# Patient Record
Sex: Female | Born: 2000
Health system: Southern US, Community
[De-identification: ages and names within clinical notes are randomized; demographics above are authoritative.]

## PROBLEM LIST (undated history)

## (undated) DIAGNOSIS — I498 Other specified cardiac arrhythmias: Secondary | ICD-10-CM

## (undated) DIAGNOSIS — G90A Postural orthostatic tachycardia syndrome (POTS): Secondary | ICD-10-CM

## (undated) DIAGNOSIS — R519 Headache, unspecified: Secondary | ICD-10-CM

## (undated) DIAGNOSIS — A069 Amebiasis, unspecified: Secondary | ICD-10-CM

## (undated) DIAGNOSIS — B070 Plantar wart: Secondary | ICD-10-CM

## (undated) DIAGNOSIS — Z8709 Personal history of other diseases of the respiratory system: Secondary | ICD-10-CM

## (undated) DIAGNOSIS — J339 Nasal polyp, unspecified: Secondary | ICD-10-CM

## (undated) DIAGNOSIS — F32A Depression, unspecified: Secondary | ICD-10-CM

## (undated) DIAGNOSIS — K5900 Constipation, unspecified: Secondary | ICD-10-CM

## (undated) DIAGNOSIS — M419 Scoliosis, unspecified: Secondary | ICD-10-CM

## (undated) DIAGNOSIS — F429 Obsessive-compulsive disorder, unspecified: Secondary | ICD-10-CM

## (undated) DIAGNOSIS — E559 Vitamin D deficiency, unspecified: Secondary | ICD-10-CM

## (undated) DIAGNOSIS — A072 Cryptosporidiosis: Secondary | ICD-10-CM

## (undated) DIAGNOSIS — K589 Irritable bowel syndrome without diarrhea: Secondary | ICD-10-CM

## (undated) DIAGNOSIS — G901 Familial dysautonomia [Riley-Day]: Secondary | ICD-10-CM

## (undated) DIAGNOSIS — F329 Major depressive disorder, single episode, unspecified: Secondary | ICD-10-CM

## (undated) HISTORY — DX: Familial dysautonomia (riley-day): G90.1

## (undated) HISTORY — DX: Personal history of other diseases of the respiratory system: Z87.09

## (undated) HISTORY — DX: Cryptosporidiosis: A07.2

## (undated) HISTORY — DX: Obsessive-compulsive disorder, unspecified: F42.9

## (undated) HISTORY — DX: Postural orthostatic tachycardia syndrome (POTS): G90.A

## (undated) HISTORY — DX: Plantar wart: B07.0

## (undated) HISTORY — DX: Amebiasis, unspecified: A06.9

## (undated) HISTORY — DX: Scoliosis, unspecified: M41.9

## (undated) HISTORY — DX: Nasal polyp, unspecified: J33.9

## (undated) HISTORY — DX: Irritable bowel syndrome, unspecified: K58.9

## (undated) HISTORY — DX: Depression, unspecified: F32.A

## (undated) HISTORY — DX: Vitamin D deficiency, unspecified: E55.9

## (undated) HISTORY — PX: OTHER SURGICAL HISTORY: SHX169

## (undated) HISTORY — DX: Other specified cardiac arrhythmias: I49.8

## (undated) HISTORY — DX: Constipation, unspecified: K59.00

## (undated) HISTORY — DX: Headache, unspecified: R51.9

---
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
## 2022-01-08 ENCOUNTER — Ambulatory Visit (INDEPENDENT_AMBULATORY_CARE_PROVIDER_SITE_OTHER): Payer: 59 | Admitting: Addiction (Substance Use Disorder)

## 2022-01-08 DIAGNOSIS — F411 Generalized anxiety disorder: Secondary | ICD-10-CM

## 2022-01-08 NOTE — Progress Notes (Signed)
[facility] Counselor/Therapist Progress Note  Patient ID: April Williams, MRN: [id number],    Date: 01/08/2022  Time Spent: 47mns  Treatment Type: Individual Therapy  Reported Symptoms: stable, content  Mental Status Exam:  Appearance:   Casual and Neat     Behavior:  Appropriate  Motor:  Normal  Speech/Language:   Clear and Coherent  Affect:  Appropriate  Mood:  normal  Thought process:  normal  Thought content:    WNL  Sensory/Perceptual disturbances:    WNL  Orientation:  x4  Attention:  Good  Concentration:  Good  Memory:  WNL  Fund of knowledge:   Good  Insight:    Good  Judgment:   Good  Impulse Control:  Good   Risk Assessment: Danger to Self:  No Self-injurious Behavior: No Danger to Others: No Duty to Warn:no Physical Aggression / Violence:No  Access to Firearms a concern: No  Gang Involvement:No   Virtual Visit via VIDEO: I connected with client by MyChart video enabled telemedicine/telehealth application, with their informed consent, and verified client privacy and that I am speaking with the correct person using two identifiers. I discussed the limitations, risks, security and privacy concerns of performing psychotherapy and management service virtually and confirmed their location. I also discussed with the patient that there may be a patient responsible charge related to this service and to confirm with the front desk if their insurance covers teletherapy. I also discussed with the patient the availability of in person appointments. The patient expressed understanding and agreed to proceed. I discussed the treatment planning with the client. The client was provided an opportunity to ask questions and all were answered. The client agreed with the plan and demonstrated an understanding of the instructions. The client was advised to call our office if symptoms worsen or feel they are in a crisis state and need immediate contact. Client also reminded of a  crisis line number and to use 9-1-1 if there's an emergency.  Therapist Location: office; Client Location: home.   Subjective: Client reported feeling very stable and happy overall both personally and in school. Client wasn't very talkative and kept reporting there "being not much to say". 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 Client processed about her major and need to learn economics and business along with animal care to be able to meet that goal. Therapist used MI & CBT with client to validate her emotions and encourage her to continue working towards her major. Therapist assessed client safety and stability; client denied SI/HI/AVH and reported stable mood.   Interventions: Cognitive Behavioral Therapy and Motivational Interviewing   Diagnosis:   ICD-10-CM   1. Generalized anxiety disorder  F41.1       Plan of Care: Client to return for weekly therapy with KSammuel Cooper therapist, to review again in 6 months.  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  Client is to seeing medication provider for support of mood management. & continue seeing medical providers for management of POTS. Client to engage in CBT: challenging negative internal ruminations and self-talk AEB journaling daily or expressing thoughts to support persons in their life and then challenging it with truth.  Client to engage in tapping to tap in healthy cognition that challenges negative  rumination or deep negative core belief.  Client to practice DBT distress tolerance skills to decrease crying spells and thoughts of not being able to endure their suffering AEB using mindfulness, deep breathing, and TIP to increase tolerance for discomfort, discharge emotional distress, and increase their understanding that  they can do hard things.  Progress: Client feeling confident about her work and workload and has made progress in routines to help her succeed in college. Client understands her need to work through past family trauma hx so that she can trust her mom's judge of character for her to date, but needs to take steps of giving her mom the benefit of the doubt and trust her character.   Barnie Del, LCSW, LCAS, CCTP, CCS, BSP

## 2022-01-16 ENCOUNTER — Encounter: Payer: Self-pay | Admitting: Family Medicine

## 2022-01-17 ENCOUNTER — Encounter: Payer: Self-pay | Admitting: Adult Health

## 2022-01-17 ENCOUNTER — Ambulatory Visit (INDEPENDENT_AMBULATORY_CARE_PROVIDER_SITE_OTHER): Payer: 59 | Admitting: Adult Health

## 2022-01-17 ENCOUNTER — Other Ambulatory Visit (HOSPITAL_COMMUNITY): Payer: Self-pay

## 2022-01-17 ENCOUNTER — Other Ambulatory Visit (HOSPITAL_BASED_OUTPATIENT_CLINIC_OR_DEPARTMENT_OTHER): Payer: Self-pay

## 2022-01-17 ENCOUNTER — Ambulatory Visit (INDEPENDENT_AMBULATORY_CARE_PROVIDER_SITE_OTHER): Payer: 59

## 2022-01-17 ENCOUNTER — Other Ambulatory Visit (HOSPITAL_COMMUNITY)
Admission: RE | Admit: 2022-01-17 | Discharge: 2022-01-17 | Disposition: A | Discharge status: Home / Self Care | Payer: 59 | Source: Ambulatory Visit

## 2022-01-17 VITALS — BP 114/68 | HR 85

## 2022-01-17 DIAGNOSIS — F5082 Avoidant/restrictive food intake disorder: Secondary | ICD-10-CM

## 2022-01-17 DIAGNOSIS — N760 Acute vaginitis: Secondary | ICD-10-CM | POA: Diagnosis not present

## 2022-01-17 DIAGNOSIS — B3731 Acute candidiasis of vulva and vagina: Secondary | ICD-10-CM

## 2022-01-17 DIAGNOSIS — F341 Dysthymic disorder: Secondary | ICD-10-CM | POA: Diagnosis not present

## 2022-01-17 DIAGNOSIS — B9689 Other specified bacterial agents as the cause of diseases classified elsewhere: Secondary | ICD-10-CM | POA: Diagnosis not present

## 2022-01-17 DIAGNOSIS — F422 Mixed obsessional thoughts and acts: Secondary | ICD-10-CM | POA: Diagnosis not present

## 2022-01-17 MED ORDER — FLUCONAZOLE 150 MG PO TABS
150.0000 mg | ORAL_TABLET | Freq: Once | ORAL | 0 refills | Status: AC
  Filled 2022-01-17: qty 1, 1d supply, fill #0

## 2022-01-17 MED ORDER — ARIPIPRAZOLE 2 MG PO TABS
2.0000 mg | ORAL_TABLET | Freq: Every day | ORAL | 3 refills | Status: DC
  Filled 2022-01-17: qty 90, 90d supply, fill #0
  Filled 2022-05-12 – 2022-08-09 (×4): qty 90, 90d supply, fill #1
  Filled 2022-11-10: qty 90, 90d supply, fill #2

## 2022-01-17 MED ORDER — SERTRALINE HCL 100 MG PO TABS
200.0000 mg | ORAL_TABLET | Freq: Every day | ORAL | 3 refills | Status: DC
  Filled 2022-01-17: qty 180, 90d supply, fill #0
  Filled 2022-06-07 (×2): qty 180, 90d supply, fill #1
  Filled 2022-09-10: qty 180, 90d supply, fill #2
  Filled 2022-12-12: qty 180, 90d supply, fill #3

## 2022-01-17 MED ORDER — TINIDAZOLE 500 MG PO TABS
2.0000 g | ORAL_TABLET | Freq: Every day | ORAL | 0 refills | Status: DC
  Filled 2022-01-17: qty 8, 2d supply, fill #0

## 2022-01-17 MED ORDER — TRAZODONE HCL 50 MG PO TABS
50.0000 mg | ORAL_TABLET | Freq: Every day | ORAL | 3 refills | Status: DC
  Filled 2022-01-17: qty 90, 90d supply, fill #0
  Filled 2022-05-11 – 2022-05-20 (×2): qty 90, 90d supply, fill #1
  Filled 2022-08-09 (×2): qty 90, 90d supply, fill #2
  Filled 2022-11-21: qty 90, 90d supply, fill #3

## 2022-01-17 NOTE — Progress Notes (Signed)
Pt presents to office today reporting vaginal dryness, itching, foul odor and clear/white discharge.   Pt instructed on how to do a self swab. Swab obtained and informed pt that results would be back in 24-48 hours. Pt aware she would be contacted with results via [place]. All questions answered at this time.

## 2022-01-17 NOTE — Addendum Note (Signed)
Addended by: Madalyn Rob D on: 01/17/2022 09:54 AM   Modules accepted: Orders, Level of Service

## 2022-01-17 NOTE — Progress Notes (Signed)
April Williams [id number] 2000/08/12 21 y.o.  Subjective:   Patient ID:  April Williams is a 21 y.o. (DOB [birth year]) female.  Chief Complaint: No chief complaint on file.   HPI April Williams presents to the office today for follow-up of persistent depressive disorder with melancholic features, currently moderate, Mixed obsessional thoughts and acts, and avoidant-restrictive food intake disorder (ARFID).  Describes mood today as "ok". Pleasant. Denies tearfulness. Mood symptoms - reports decreased depression, anxiety, and irritability. Mood is consistent. Stating "I'm doing ok - I feel the happiest I've been". Feels like medications work well. Stable interest and motivation. Seeing therapist - [name] Taking medications as prescribed.  Energy levels pretty good. Has a regular exercise routine. Enjoys some usual interests and activities. Single. Dating. Lives with mother - hedgehog [name]) Sister lives 1.5 hours away - niece 34 years old. Father and brother lives in [place]. Spending time with family. Appetite adequate. Weight stable. Sleeps well most nights with Trazadone. Averages 8 to 10 hours.  Focus and concentration "ok". Completing some household tasks. Managing aspects of household. Taking classes at a community college and is planning to attend a 4 year university. Denies SI or HI.  Denies AH or VH. Denies self harm.  Previous medication trials: [place] Office Visit from 09/20/2021 in [facility] [place] from 04/07/2018 in [facility] Office Visit from 09/02/2017 in [facility] Office Visit from 08/01/2017 in [facility] Office Visit from 06/30/2017 in [facility]  Total GAD-7 Score '4 5 1 5 2      '$ PHQ2-9    [place] Visit from 09/20/2021 in [place] [facility] [place] [facility] [place] from 04/07/2018 in [facility] Office Visit from 09/02/2017 in [facility] Office Visit from 08/01/2017 in [facility] Office Visit from 06/30/2017 in [facility]  PHQ-2 Total Score 3 2 0 1 0  PHQ-9 Total Score '8 7 9 11 8        '$ Review of Systems:  Review of Systems  Musculoskeletal:  Negative for gait problem.  Neurological:  Negative for tremors.  Psychiatric/Behavioral:         Please refer to HPI    Medications: I have reviewed the patient's current medications.  Current Outpatient Medications  Medication Sig Dispense Refill   ARIPiprazole (ABILIFY) 2 MG tablet Take 1 tablet (2 mg total) by mouth daily. 90 tablet 3   cholecalciferol (VITAMIN D) 1000 units tablet Take 4,000 Units by mouth daily.     Drospirenone (SLYND) 4 MG TABS Take 1 tablet by mouth daily. 84 tablet 4   ferrous sulfate 324 (65 Fe) MG TBEC Take 1 tablet by mouth in the morning and 1 tablet in the evening with meals. If causes constipation, begin miralax daily as needed. Check Hgb/ferritin in 3 months.. 90 tablet 3   Melatonin 10 MG TABS Take by mouth.     metoprolol tartrate (LOPRESSOR) 25 MG tablet TAKE 1 TABLET BY MOUTH ONCE A DAY 90 tablet 2   penicillin v potassium (VEETID) 500 MG tablet Take 1 tablet (500 mg total) by mouth 4 (four) times daily until gone 28 tablet 0   senna (SENOKOT)  8.6 MG tablet Take 2 tablets by mouth daily.     sertraline (ZOLOFT) 100 MG tablet Take 2 tablets (200 mg total) by mouth daily. 180 tablet 3   traZODone (DESYREL) 50 MG tablet Take 1 tablet (50 mg total) by mouth at bedtime. 90 tablet 3   No current facility-administered medications for this visit.    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   Please see review of systems for further details on the patient's review from today.   Objective:   Physical Exam:  There were no vitals taken for this visit.  Physical Exam Constitutional:      General: She is not in acute distress. Musculoskeletal:        General: No deformity.  Neurological:     Mental Status: She is alert and oriented to person, place, and time.     Coordination: Coordination normal.  Psychiatric:        Attention and Perception: Attention and perception normal. She does not perceive auditory or visual hallucinations.        Mood and Affect: Mood normal. Mood is not anxious or depressed. Affect is not labile, blunt, angry or inappropriate.        Speech: Speech normal.        Behavior: Behavior normal.        Thought Content: Thought content normal. Thought content is not paranoid or delusional. Thought content does not include homicidal or suicidal ideation. Thought content does not include homicidal or suicidal plan.        Cognition and Memory: Cognition and memory normal.        Judgment: Judgment normal.     Comments: Insight intact     Lab Review:     Component Value Date/Time   NA 136 01/27/2019 1015   K 4.9 01/27/2019 1015   CL 105 01/27/2019 1015   CO2 22 01/27/2019 1015   GLUCOSE 91 01/27/2019 1015   BUN 9 01/27/2019 1015   CREATININE 0.70 01/27/2019 1015   CALCIUM 9.9 01/27/2019 1015   PROT 7.2 01/27/2019 1015    ALBUMIN 4.1 12/07/2016 1402   AST 13 01/27/2019 1015   ALT 10 01/27/2019 1015   ALKPHOS 117 12/07/2016 1402   BILITOT 0.7 01/27/2019 1015   GFRNONAA NOT CALCULATED 12/07/2016 1402   GFRAA NOT CALCULATED 12/07/2016 1402       Component Value Date/Time   WBC 8.8 12/07/2016 1402   RBC 5.53 (H) 12/07/2016 1402   HGB 15.4 (H) 12/07/2016 1402   HCT 44.3 (H) 12/07/2016 1402   PLT 424 (H) 12/07/2016 1402   MCV 80.1 12/07/2016 1402   MCH 27.8 12/07/2016 1402   MCHC 34.8 12/07/2016 1402   RDW 12.0 12/07/2016 1402   LYMPHSABS 2.0 12/07/2016 1402   MONOABS 1.0 12/07/2016 1402   EOSABS 0.1 12/07/2016 1402   BASOSABS 0.0 12/07/2016 1402  No results found for: "POCLITH", "LITHIUM"   No results found for: "PHENYTOIN", "PHENOBARB", "VALPROATE", "CBMZ"   .res Assessment: Plan:    Plan:  PDMP reviewed  1. Zoloft '200mg'$  daily 2. Abilify '2mg'$  daily 3. Trazadone '25mg'$  at hs    RTC 6 months  Discussed potential metabolic side effects associated with atypical antipsychotics, as well as potential risk for movement side effects. Advised pt to contact office if movement side effects occur.    Patient advised to contact office with any questions, adverse effects, or acute worsening in signs and symptoms. Diagnoses and all orders for this visit:  Avoidant-restrictive food intake disorder (ARFID)  Persistent depressive disorder with melancholic features, currently moderate -     ARIPiprazole (ABILIFY) 2 MG tablet; Take 1 tablet (2 mg total) by mouth daily.  Mixed obsessional thoughts and acts -     traZODone (DESYREL) 50 MG tablet; Take 1 tablet (50 mg total) by mouth at bedtime. -     sertraline (ZOLOFT) 100 MG tablet; Take 2 tablets (200 mg total) by mouth daily.     Please see After Visit Summary for patient specific instructions.  Future Appointments  Date Time Provider [place]  01/30/2022  8:30 AM Early, Coralee Pesa, NP [facility]  02/05/2022  2:00 PM Barnie Del, LCSW  [facility] None  07/18/2022  8:00 AM Donabelle Molden, Berdie Ogren, NP [facility] None    No orders of the defined types were placed in this encounter.   -------------------------------

## 2022-01-18 LAB — CERVICOVAGINAL ANCILLARY ONLY
Bacterial Vaginitis (gardnerella): NEGATIVE
Candida Glabrata: NEGATIVE
Candida Vaginitis: NEGATIVE
Comment: NEGATIVE
Comment: NEGATIVE
Comment: NEGATIVE

## 2022-01-30 ENCOUNTER — Encounter (HOSPITAL_BASED_OUTPATIENT_CLINIC_OR_DEPARTMENT_OTHER): Payer: Self-pay | Admitting: Nurse Practitioner

## 2022-01-30 ENCOUNTER — Ambulatory Visit (HOSPITAL_BASED_OUTPATIENT_CLINIC_OR_DEPARTMENT_OTHER): Payer: 59 | Admitting: Nurse Practitioner

## 2022-01-30 VITALS — BP 105/75 | HR 95 | Ht 66.0 in | Wt 132.0 lb

## 2022-01-30 DIAGNOSIS — Z23 Encounter for immunization: Secondary | ICD-10-CM | POA: Diagnosis not present

## 2022-01-30 DIAGNOSIS — F422 Mixed obsessional thoughts and acts: Secondary | ICD-10-CM

## 2022-01-30 DIAGNOSIS — F341 Dysthymic disorder: Secondary | ICD-10-CM

## 2022-01-30 DIAGNOSIS — Z0000 Encounter for general adult medical examination without abnormal findings: Secondary | ICD-10-CM | POA: Diagnosis not present

## 2022-01-30 DIAGNOSIS — G90A Postural orthostatic tachycardia syndrome (POTS): Secondary | ICD-10-CM

## 2022-01-30 DIAGNOSIS — G901 Familial dysautonomia [Riley-Day]: Secondary | ICD-10-CM

## 2022-01-30 DIAGNOSIS — F4323 Adjustment disorder with mixed anxiety and depressed mood: Secondary | ICD-10-CM

## 2022-01-30 NOTE — Patient Instructions (Signed)
Thank you for choosing [place] [facility] for your Primary Care needs. I am excited for the opportunity to partner with you to meet your health care goals. It was a pleasure meeting you today!  Recommendations from today's visit: You are looking great today! Please let me know if you have any concerns or needs.   Information on diet, exercise, and health maintenance recommendations are listed below. This is information to help you be sure you are on track for optimal health and monitoring.   Please look over this and let us know if you have any questions or if you have completed any of the health maintenance outside of [place] so that we can be sure your records are up to date.  ___________________________________________________________ About Me: I am an Adult-Geriatric Nurse Practitioner with a background in caring for patients for more than 20 years with a strong intensive care background. I provide primary care and sports medicine services to patients age 39 and older within this office. My education had a strong focus on caring for the older adult population, which I am passionate about. I am also the director of the APP Fellowship with [facility].   My desire is to provide you with the best service through preventive medicine and supportive care. I consider you a part of the medical team and value your input. I work diligently to ensure that you are heard and your needs are met in a safe and effective manner. I want you to feel comfortable with me as your provider and want you to know that your health concerns are important to me.  For your information, our office hours are: Monday, Tuesday, and Thursday 8:00 AM - 5:00 PM Wednesday and Friday 8:00 AM - 12:00 PM.   In my time away from the office I am teaching new APP's within the system and am unavailable, but my partner, Dr. Burnard Bunting is in the office for emergent needs.   If you have questions or concerns,  please call our office at [phone] or send [place] a MyChart message and we will respond as quickly as possible.  ____________________________________________________________ MyChart:  For all urgent or time sensitive needs we ask that you please call the office to avoid delays. Our number is [phone]. MyChart is not constantly monitored and due to the large volume of messages a day, replies may take up to 72 business hours.  MyChart Policy: MyChart allows for you to see your visit notes, after visit summary, provider recommendations, lab and tests results, make an appointment, request refills, and contact your provider or the office for non-urgent questions or concerns. Providers are seeing patients during normal business hours and do not have built in time to review MyChart messages.  We ask that you allow a minimum of 3 business days for responses to [employer]. For this reason, please do not send urgent requests through [employer]. Please call the office at [phone]. New and ongoing conditions may require a visit. We have virtual and in person visit available for your convenience.  Complex MyChart concerns may require a visit. Your provider may request you schedule a virtual or in person visit to ensure we are providing the best care possible. MyChart messages sent after 11:00 AM on Friday will not be received by the provider until Monday morning.    Lab and Test Results: You will receive your lab and test results on MyChart as soon as they are completed and results have been  sent by the lab or testing facility. Due to this service, you will receive your results BEFORE your provider.  I review lab and tests results each morning prior to seeing patients. Some results require collaboration with other providers to ensure you are receiving the most appropriate care. For this reason, we ask that you please allow a minimum of 3-5 business days from the time the ALL results have been received  for your provider to receive and review lab and test results and contact you about these.  Most lab and test result comments from the provider will be sent through [place]. Your provider may recommend changes to the plan of care, follow-up visits, repeat testing, ask questions, or request an office visit to discuss these results. You may reply directly to this message or call the office at [phone] to provide information for the provider or set up an appointment. In some instances, you will be called with test results and recommendations. Please let us know if this is preferred and we will make note of this in your chart to provide this for you.    If you have not heard a response to your lab or test results in 5 business days from all results returning to [place], please call the office to let us know. We ask that you please avoid calling prior to this time unless there is an emergent concern. Due to high call volumes, this can delay the resulting process.  After Hours: For all non-emergency after hours needs, please call the office at [phone] and select the option to reach the on-call provider service. On-call services are shared between multiple [facility] offices and therefore it will not be possible to speak directly with your provider. On-call providers may provide medical advice and recommendations, but are unable to provide refills for maintenance medications.  For all emergency or urgent medical needs after normal business hours, we recommend that you seek care at the closest Urgent Care or Emergency Department to ensure appropriate treatment in a timely manner.  [facility] has a 24 hour emergency room located on the ground floor for your convenience.   Urgent Concerns During the Business Day Providers are seeing patients from 8AM to [place] with a busy schedule and are most often not able to respond to non-urgent calls until the end of the day or the next business  day. If you should have URGENT concerns during the day, please call and speak to the nurse or schedule a same day appointment so that we can address your concern without delay.   Thank you, again, for choosing me as your health care partner. I appreciate your trust and look forward to learning more about you.   Worthy Keeler, DNP, AGNP-c ___________________________________________________________  Health Maintenance Recommendations Screening Testing Mammogram Every 1 -2 years based on history and risk factors Starting at age 12 Pap Smear Ages 21-39 every 3 years Ages 79-65 every 5 years with HPV testing More frequent testing may be required based on results and history Colon Cancer Screening Every 1-10 years based on test performed, risk factors, and history Starting at age 63 Bone Density Screening Every 2-10 years based on history Starting at age 29 for women Recommendations for men differ based on medication usage, history, and risk factors AAA Screening One time ultrasound Men 73-23 years old who have every smoked Lung Cancer Screening Low Dose Lung CT every 12 months Age 56-80 years with a 30 pack-year smoking history who still smoke  or who have quit within the last 15 years  Screening Labs Routine  Labs: Complete Blood Count (CBC), Complete Metabolic Panel (CMP), Cholesterol (Lipid Panel) Every 6-12 months based on history and medications May be recommended more frequently based on current conditions or previous results Hemoglobin A1c Lab Every 3-12 months based on history and previous results Starting at age 76 or earlier with diagnosis of diabetes, high cholesterol, BMI >26, and/or risk factors Frequent monitoring for patients with diabetes to ensure blood sugar control Thyroid Panel (TSH w/ T3 & T4) Every 6 months based on history, symptoms, and risk factors May be repeated more often if on medication HIV One time testing for all patients 10 and older May be  repeated more frequently for patients with increased risk factors or exposure Hepatitis C One time testing for all patients 94 and older May be repeated more frequently for patients with increased risk factors or exposure Gonorrhea, Chlamydia Every 12 months for all sexually active persons 13-24 years Additional monitoring may be recommended for those who are considered high risk or who have symptoms PSA Men 36-39 years old with risk factors Additional screening may be recommended from age 37-69 based on risk factors, symptoms, and history  Vaccine Recommendations Tetanus Booster All adults every 10 years Flu Vaccine All patients 6 months and older every year COVID Vaccine All patients 12 years and older Initial dosing with booster May recommend additional booster based on age and health history HPV Vaccine 2 doses all patients age 7-26 Dosing may be considered for patients over 26 Shingles Vaccine (Shingrix) 2 doses all adults 17 years and older Pneumonia (Pneumovax 23) All adults 59 years and older May recommend earlier dosing based on health history Pneumonia (Prevnar 53) All adults 27 years and older Dosed 1 year after Pneumovax 23  Additional Screening, Testing, and Vaccinations may be recommended on an individualized basis based on family history, health history, risk factors, and/or exposure.  __________________________________________________________  Diet Recommendations for All Patients  I recommend that all patients maintain a diet low in saturated fats, carbohydrates, and cholesterol. While this can be challenging at first, it is not impossible and small changes can make big differences.  Things to try: Decreasing the amount of soda, sweet tea, and/or juice to one or less per day and replace with water While water is always the first choice, if you do not like water you may consider adding a water additive without sugar to improve the taste other sugar free  drinks Replace potatoes with a brightly colored vegetable at dinner Use healthy oils, such as canola oil or olive oil, instead of butter or hard margarine Limit your bread intake to two pieces or less a day Replace regular pasta with low carb pasta options Bake, broil, or grill foods instead of frying Monitor portion sizes  Eat smaller, more frequent meals throughout the day instead of large meals  An important thing to remember is, if you love foods that are not great for your health, you don't have to give them up completely. Instead, allow these foods to be a reward when you have done well. Allowing yourself to still have special treats every once in a while is a nice way to tell yourself thank you for working hard to keep yourself healthy.   Also remember that every day is a new day. If you have a bad day and "fall off the wagon", you can still climb right back up and keep moving along on your  journey!  We have resources available to help you!  Some websites that may be helpful include: www.http://carter.biz/  Www.VeryWellFit.com _____________________________________________________________  Activity Recommendations for All Patients  I recommend that all adults get at least 20 minutes of moderate physical activity that elevates your heart rate at least 5 days out of the week.  Some examples include: Walking or jogging at a pace that allows you to carry on a conversation Cycling (stationary bike or outdoors) Water aerobics Yoga Weight lifting Dancing If physical limitations prevent you from putting stress on your joints, exercise in a pool or seated in a chair are excellent options.  Do determine your MAXIMUM heart rate for activity: YOUR AGE - 220 = MAX HeartRate   Remember! Do not push yourself too hard.  Start slowly and build up your pace, speed, weight, time in exercise, etc.  Allow your body to rest between exercise and get good sleep. You will need more water than normal when  you are exerting yourself. Do not wait until you are thirsty to drink. Drink with a purpose of getting in at least 8, 8 ounce glasses of water a day plus more depending on how much you exercise and sweat.    If you begin to develop dizziness, chest pain, abdominal pain, jaw pain, shortness of breath, headache, vision changes, lightheadedness, or other concerning symptoms, stop the activity and allow your body to rest. If your symptoms are severe, seek emergency evaluation immediately. If your symptoms are concerning, but not severe, please let us know so that we can recommend further evaluation.

## 2022-01-30 NOTE — Progress Notes (Signed)
April Render, DNP, AGNP-c Primary Care [facility] Sports Medicine [address]  April Williams, [place] [address] [phone]  New patient visit   Patient: April Williams   DOB: July 02, 2000   21 y.o. Female  MRN: [id number] Visit Date: 01/30/2022  Patient Care Team: April Render, NP as PCP - General (Nurse Practitioner)  Today's Vitals   01/30/22 0826  BP: 105/75  Pulse: 95  SpO2: 98%  Weight: 132 lb (59.9 kg)  Height: 5' 6" (1.676 m)   Body mass index is 21.31 kg/m.   Today's healthcare provider: Orma Render, NP   Chief Complaint  Patient presents with   New Patient (Initial Visit)    Pt presents today to establish care. Pt would like flu sot while in office.    Subjective    April Williams is a 21 y.o. female who presents today as a new patient to establish care.    Patient endorses the following concerns presently: She is studying at the community college online. She is not sure what she would like to do.  She is seeing a cardiology specialist for her POTS and Dysautonomia and psychiatry for anxiety and depressive disorder. She does require quite a few visits for this condition and this requires her mother to drive her. She has used FMLA in the past for her mothers work to help ensure that her job is protected.   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    Irritable bowel syndrome    Nasal polyps    Obsessive-compulsive disorder    Physical deconditioning 04/21/2018   Plantar wart    POTS (postural orthostatic tachycardia syndrome)    Scoliosis of thoracolumbar spine    Vitamin D deficiency    Past Surgical History:   Procedure Laterality Date   NASAL SINUS SURGERY  06/12/2017   Family Status  Relation Name Status   Mother  Alive   Father  Alive   Sister 47 yo Alive   Brother 50 yo Nurse, adult  (Not Specified)   MGM  Alive   MGF  Deceased   PGM  Deceased   PGF  Alive   Other 2nd Uncle Alive   Neg Hx  (Not Specified)   Family History  Problem Relation Age of Onset   Depression Mother    OCD Mother    Obesity Mother    Asthma Brother    Stroke Maternal Uncle    Depression Maternal Grandmother    Diabetes Paternal Grandmother    Seizures Other    Migraines Neg Hx    Anxiety disorder Neg Hx    Bipolar disorder Neg Hx    Schizophrenia Neg Hx    ADD / ADHD Neg Hx    Autism Neg Hx    Social History   Socioeconomic History   Marital status: Single    Spouse name: Not on file   Number of children: Not on file   Years of education: Not on file   Highest education level: 9th grade  Occupational History   Occupation: student  Tobacco Use   Smoking  status: Never    Passive exposure: Never   Smokeless tobacco: Never  Vaping Use   Vaping Use: Never used  Substance and Sexual Activity   Alcohol use: Never   Drug use: Never   Sexual activity: Not Currently  Other Topics Concern   Not on file  Social History Narrative   April Williams is at [employer] for dual enrollment with an associates in science. She would like to be an Cytogeneticist.       She lives with mother and goes to her father's house every other weekend. She has a Cat and a Hedgehog   Has been primarily with mother since Covid 19 restrictions.    She enjoys playing video games, sing, and sew.          06/15/2019: Self-directed in schoolwork generally A and B grades currently taking AP psychology planning considering herself undeserving for low grade or delayed assignment completion which is always B or better than average.  Patient wishes to have dual educational program next year in 11th grade with classes also at [employer].  She has been inflexible and constricted in repertoire of nutrition and activity, though she can spontaneously enjoy social activity with friends as recent as a year ago as well as volunteering for special needs children to have Friday night activities before [place].  Otherwise she has maintained only 1 friend though she enjoys the cat at home though having to clean after it.  Patient feels tormented by older sister who has a 85-monthold baby patient loves as with father or psychologist wanting her to gain weight to become healthy.   Social Determinants of Health   Financial Resource Strain: Not on file  Food Insecurity: No Food Insecurity (09/20/2021)   Hunger Vital Sign    Worried About Running Out of Food in the Last Year: Never true    Ran Out of Food in the Last Year: Never true  Transportation Needs: No Transportation Needs (09/20/2021)   PRAPARE - THydrologist(Medical): No    Lack of Transportation (Non-Medical): No  Physical Activity: Not on file  Stress: Not on file  Social Connections: Not on file   Outpatient Medications Prior to Visit  Medication Sig   ARIPiprazole (ABILIFY) 2 MG tablet Take 1 tablet (2 mg total) by mouth daily.   cholecalciferol (VITAMIN D) 1000 units tablet Take 4,000 Units by mouth daily.   ferrous sulfate 324 (65 Fe) MG TBEC Take 1 tablet by mouth in the morning and 1 tablet in the evening with meals. If causes constipation, begin miralax daily as needed. Check Hgb/ferritin in 3 months..   Magnesium Hydroxide 1200 MG CHEW Chew 1,200 mg by mouth daily.   Melatonin 10 MG TABS Take by mouth.   midodrine (PROAMATINE) 10 MG tablet Take 10 mg by mouth 3 (three) times daily.   penicillin v potassium (VEETID) 500 MG tablet Take 1 tablet (500 mg total) by mouth 4 (four) times daily until gone   senna (SENOKOT) 8.6 MG tablet Take 2 tablets by mouth daily.   sertraline (ZOLOFT) 100 MG tablet Take 2 tablets (200 mg total) by mouth daily.    tinidazole (TINDAMAX) 500 MG tablet Take 4 tablets (2,000 mg total) by mouth daily with breakfast.   traZODone (DESYREL) 50 MG tablet Take 1 tablet (50 mg total) by mouth at bedtime.   [DISCONTINUED] Drospirenone (SLYND) 4 MG TABS Take 1 tablet by mouth daily.   [DISCONTINUED] midodrine (PROAMATINE) 5 MG  tablet Take 5 mg by mouth 3 (three) times daily with meals.   metoprolol tartrate (LOPRESSOR) 25 MG tablet TAKE 1 TABLET BY MOUTH ONCE A DAY   No facility-administered medications prior to visit.   No Known Allergies Immunization History  Administered Date(s) Administered   DTaP 02/16/2003, 04/18/2003, 06/16/2003, 03/13/2004, 12/19/2006   Dtap, Unspecified 02/16/2003, 04/18/2003, 06/16/2003, 03/13/2004, 12/19/2006   HIB (PRP-OMP) 02/16/2003, 04/18/2003, 06/16/2003, 03/13/2004   HIB (PRP-T) 02/16/2003, 04/18/2003, 06/16/2003, 03/13/2004   HPV 9-valent 01/09/2017   Hep A, Unspecified 03/20/2006, 12/19/2006   Hep B, Unspecified Jul 25, 2002, 01/17/2003, 06/16/2003   Hepatitis A, Ped/Adol-2 Dose 03/20/2006, 12/19/2006   Hepatitis B, PED/ADOLESCENT 09/08/2002, 01/17/2003, 06/16/2003   IPV 02/16/2003, 04/18/2003, 12/20/2003, 12/19/2006   Influenza Split 01/02/2015   Influenza, Seasonal, Injecte, Preservative Fre 01/10/2010, 12/26/2010, 12/27/2011   Influenza,inj,Quad PF,6+ Mos 12/28/2012, 12/30/2013, 01/02/2015, 02/02/2016, 02/02/2016, 01/09/2017, 01/09/2017, 01/13/2018, 12/23/2018, 12/23/2018, 01/11/2020, 01/26/2021, 01/30/2022   Influenza-Unspecified 12/20/2003   MMR 12/20/2003, 12/19/2006   Meningococcal Conjugate 12/30/2013, 01/26/2019   Meningococcal Polysaccharide 01/30/2022   PFIZER(Purple Top)SARS-COV-2 Vaccination 07/30/2019, 08/20/2019   Pneumococcal Conjugate-13 03/09/2003, 04/18/2003, 06/16/2003, 03/13/2004   Polio, Unspecified 02/16/2003, 04/18/2003, 12/20/2003, 12/19/2006   Td (Adult),5 Lf Tetanus Toxid, Preservative Free 12/28/2012   Tdap 12/28/2012   Varicella 12/20/2003,  12/19/2006, 12/19/2012    Review of Systems All review of systems negative except what is listed in the HPI   Objective    BP 105/75   Pulse 95   Ht 5' 6" (1.676 m)   Wt 132 lb (59.9 kg)   SpO2 98%   BMI 21.31 kg/m  Physical Exam Vitals and nursing note reviewed.  Constitutional:      General: She is not in acute distress.    Appearance: Normal appearance.  HENT:     Head: Normocephalic and atraumatic.     Right Ear: Hearing, tympanic membrane, ear canal and external ear normal.     Left Ear: Hearing, tympanic membrane, ear canal and external ear normal.     Nose: Nose normal.     Right Sinus: No maxillary sinus tenderness or frontal sinus tenderness.     Left Sinus: No maxillary sinus tenderness or frontal sinus tenderness.     Mouth/Throat:     Lips: Pink.     Mouth: Mucous membranes are moist.     Pharynx: Oropharynx is clear.  Eyes:     General: Lids are normal. Vision grossly intact.     Extraocular Movements: Extraocular movements intact.     Conjunctiva/sclera: Conjunctivae normal.     Pupils: Pupils are equal, round, and reactive to light.     Funduscopic exam:    Right eye: Red reflex present.        Left eye: Red reflex present.    Visual Fields: Right eye visual fields normal and left eye visual fields normal.  Neck:     Thyroid: No thyromegaly.     Vascular: No carotid bruit.  Cardiovascular:     Rate and Rhythm: Regular rhythm. Tachycardia present.     Chest Wall: PMI is not displaced.     Pulses: Normal pulses.          Dorsalis pedis pulses are 2+ on the right side and 2+ on the left side.       Posterior tibial pulses are 2+ on the right side and 2+ on the left side.     Heart sounds: Normal heart sounds. No murmur heard. Pulmonary:     Effort: Pulmonary effort  is normal. No respiratory distress.     Breath sounds: Normal breath sounds.  Abdominal:     General: Abdomen is flat. Bowel sounds are normal. There is no distension.     Palpations:  Abdomen is soft. There is no hepatomegaly, splenomegaly or mass.     Tenderness: There is no abdominal tenderness. There is no right CVA tenderness, left CVA tenderness, guarding or rebound.  Musculoskeletal:        General: Normal range of motion.     Cervical back: Full passive range of motion without pain, normal range of motion and neck supple. No tenderness.     Right lower leg: No edema.     Left lower leg: No edema.  Feet:     Left foot:     Toenail Condition: Left toenails are normal.  Lymphadenopathy:     Cervical: No cervical adenopathy.     Upper Body:     Right upper body: No supraclavicular adenopathy.     Left upper body: No supraclavicular adenopathy.  Skin:    General: Skin is warm and dry.     Capillary Refill: Capillary refill takes less than 2 seconds.     Nails: There is no clubbing.  Neurological:     General: No focal deficit present.     Mental Status: She is alert and oriented to person, place, and time.     GCS: GCS eye subscore is 4. GCS verbal subscore is 5. GCS motor subscore is 6.     Sensory: Sensation is intact.     Motor: Motor function is intact.     Coordination: Coordination is intact.     Gait: Gait is intact.     Deep Tendon Reflexes: Reflexes are normal and symmetric.  Psychiatric:        Attention and Perception: Attention normal.        Mood and Affect: Mood normal.        Speech: Speech normal.        Behavior: Behavior normal. Behavior is cooperative.        Thought Content: Thought content normal.        Cognition and Memory: Cognition and memory normal.        Judgment: Judgment normal.     No results found for any visits on 01/30/22.  Assessment & Plan      Problem List Items Addressed This Visit     POTS (postural orthostatic tachycardia syndrome)    Chronic. Seeing specialist for management with frequent visits requiring travel and her mother to take her. Will complete FMLA paperwork when it is received for family leave. No  alarm sx present today.       Relevant Medications   midodrine (PROAMATINE) 10 MG tablet   Dysautonomia (HCC)    Chronic. Seeing a specialist for management. No alarm sx at this time. Continue current therapy and monitoring. Will follow closely.       Adjustment disorder with mixed anxiety and depressed mood    Chronic. Doing well on current therapy. No changes at this time. No alarm sx present.       Obsessive compulsive disorder    Chronic. Well controlled. No changes.       Persistent depressive disorder with melancholic features, currently moderate    Chronic. Doing well at this time with no alarm sx. No changes.       Encounter for medical examination to establish care - Primary    CPE today  with no abnormalities noted on exam.  Labs pending. Will make changes as necessary based on results.  Review of HM activities and recommendations discussed and provided on AVS Anticipatory guidance, diet, and exercise recommendations provided.  Medications, allergies, and hx reviewed and updated as necessary.  Plan to f/u with CPE in 1 year or sooner for acute/chronic health needs as directed.        Other Visit Diagnoses     Health care maintenance       Flu vaccine need       Relevant Orders   Flu Vaccine QUAD 6+ mos PF IM (Fluarix Quad PF) (Completed)   Need for meningococcal vaccination       Relevant Orders   Meningococcal polysaccharide vaccine (Menomune) (Completed)        Return in about 1 year (around 01/31/2023) for CPE today- CPE in 1 year- following to [place].      Dariel Betzer, Coralee Pesa, NP, DNP, AGNP-C [facility] at [place] [facility] Maintenance Due Health Maintenance Topics with due status: Overdue     Topic Date Due   HPV VACCINES 02/06/2017   HIV Screening Never done   COVID-19 Vaccine 09/17/2019   Hepatitis C Screening Never done    CPE Due  Labs Due

## 2022-02-05 ENCOUNTER — Ambulatory Visit: Payer: 59 | Admitting: Addiction (Substance Use Disorder)

## 2022-02-05 DIAGNOSIS — F411 Generalized anxiety disorder: Secondary | ICD-10-CM

## 2022-02-05 NOTE — Telephone Encounter (Signed)
FMLA received from [employer]  7 pages,  Copied,  Noted  Placed in providers box

## 2022-02-05 NOTE — Progress Notes (Signed)
Client no showed. But 1st no show, so no charge.

## 2022-03-04 ENCOUNTER — Other Ambulatory Visit (HOSPITAL_COMMUNITY): Payer: Self-pay

## 2022-03-04 ENCOUNTER — Encounter: Payer: Self-pay | Admitting: Family Medicine

## 2022-03-04 MED ORDER — NORETHINDRONE 0.35 MG PO TABS
1.0000 | ORAL_TABLET | Freq: Every day | ORAL | 5 refills | Status: DC
  Filled 2022-03-04: qty 84, 84d supply, fill #0
  Filled 2022-05-28: qty 84, 84d supply, fill #1
  Filled 2022-08-16: qty 84, 84d supply, fill #2
  Filled 2022-11-10: qty 84, 84d supply, fill #3
  Filled 2023-02-02: qty 84, 84d supply, fill #4

## 2022-03-05 ENCOUNTER — Other Ambulatory Visit (HOSPITAL_COMMUNITY): Payer: Self-pay

## 2022-03-06 DIAGNOSIS — G901 Familial dysautonomia [Riley-Day]: Secondary | ICD-10-CM | POA: Diagnosis not present

## 2022-03-21 DIAGNOSIS — Z0000 Encounter for general adult medical examination without abnormal findings: Secondary | ICD-10-CM | POA: Insufficient documentation

## 2022-03-21 NOTE — Assessment & Plan Note (Signed)
Chronic. Doing well at this time with no alarm sx. No changes.

## 2022-03-21 NOTE — Assessment & Plan Note (Signed)
Chronic. Seeing specialist for management with frequent visits requiring travel and her mother to take her. Will complete FMLA paperwork when it is received for family leave. No alarm sx present today.

## 2022-03-21 NOTE — Assessment & Plan Note (Signed)
Chronic. Well controlled. No changes.

## 2022-03-21 NOTE — Assessment & Plan Note (Signed)
Chronic. Doing well on current therapy. No changes at this time. No alarm sx present.

## 2022-03-21 NOTE — Assessment & Plan Note (Signed)

## 2022-03-21 NOTE — Assessment & Plan Note (Signed)
Chronic. Seeing a specialist for management. No alarm sx at this time. Continue current therapy and monitoring. Will follow closely.

## 2022-04-11 ENCOUNTER — Other Ambulatory Visit: Payer: Self-pay

## 2022-04-11 DIAGNOSIS — K5904 Chronic idiopathic constipation: Secondary | ICD-10-CM

## 2022-04-11 NOTE — Progress Notes (Signed)
Pt called and states has possible urethral prolpase from straining. Mother aware. Pt also needing PT referral for constipation per Dr Claiborne Billings, GI.  Referral for PT placed.  Referral for UROGYN placed.  Colletta Maryland, RNC

## 2022-05-13 ENCOUNTER — Other Ambulatory Visit: Payer: Self-pay

## 2022-05-14 ENCOUNTER — Encounter (HOSPITAL_COMMUNITY): Payer: Self-pay

## 2022-05-14 ENCOUNTER — Other Ambulatory Visit (HOSPITAL_COMMUNITY): Payer: Self-pay

## 2022-05-15 ENCOUNTER — Other Ambulatory Visit: Payer: Self-pay

## 2022-05-17 ENCOUNTER — Other Ambulatory Visit: Payer: Self-pay

## 2022-05-20 ENCOUNTER — Other Ambulatory Visit (HOSPITAL_COMMUNITY): Payer: Self-pay

## 2022-05-22 ENCOUNTER — Other Ambulatory Visit (HOSPITAL_COMMUNITY)
Admission: RE | Admit: 2022-05-22 | Discharge: 2022-05-22 | Disposition: A | Discharge status: Home / Self Care | Payer: 59 | Source: Ambulatory Visit | Attending: Obstetrics and Gynecology | Admitting: Obstetrics and Gynecology

## 2022-05-22 ENCOUNTER — Ambulatory Visit (INDEPENDENT_AMBULATORY_CARE_PROVIDER_SITE_OTHER): Payer: 59 | Admitting: Obstetrics and Gynecology

## 2022-05-22 ENCOUNTER — Encounter: Payer: Self-pay | Admitting: Obstetrics and Gynecology

## 2022-05-22 VITALS — BP 90/62 | HR 88 | Ht 67.09 in | Wt 143.2 lb

## 2022-05-22 DIAGNOSIS — N898 Other specified noninflammatory disorders of vagina: Secondary | ICD-10-CM | POA: Insufficient documentation

## 2022-05-22 DIAGNOSIS — R350 Frequency of micturition: Secondary | ICD-10-CM

## 2022-05-22 NOTE — Progress Notes (Signed)
[facility] New Patient Evaluation and Consultation  Referring Provider: Caren Macadam,* PCP: Orma Render, NP Date of Service: 05/22/2022  SUBJECTIVE Chief Complaint: New Patient (Initial Visit) April Williams is a 22 y.o. female is here due to "something bulging out of the urethera".)  History of Present Illness: April Williams is a 22 y.o. White or Caucasian female seen in consultation at the request of Dr. Ernestina Patches for evaluation of urethral prolapse and constipation.    Review of records significant for: Pt reports possible urethral prolapse from straining with constipation  Urinary Symptoms: Does not leak urine.   Day time voids 6.  Nocturia: 0 times per night to void. Voiding dysfunction: she empties her bladder well.  does not use a catheter to empty bladder.  When urinating, she feels she has no difficulties  UTIs:  0  UTI's in the last year.   Denies history of blood in urine and kidney or bladder stones  Pelvic Organ Prolapse Symptoms:                  She Admits to a feeling of a bulge the vaginal area- feels more in the urethra. It has been present for a few weeks. Noticed the urethra was bulging more and was more "pointy". Symptoms came out of nowhere. Denies pain or bleeding from the area.  Feels it is "occluding" the vaginal opening and placing tampons is difficult This bulge is bothersome. She reports there is some concern that she may have a connective tissue disorder but does not qualify for [employer].   Bowel Symptom: Bowel movements: 1 time(s) per week Stool consistency: loose Straining: yes.  Splinting: no.  Incomplete evacuation: yes.  She Denies accidental bowel leakage / fecal incontinence Bowel regimen: stool softener and dulcolax, senna - sees GI in [place]  Sexual Function Sexually active: no.  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   Medications: She has a current medication list which includes the following prescription(s): aripiprazole, cholecalciferol, ferrous sulfate, fludrocortisone, magnesium hydroxide, melatonin, midodrine, norethindrone, senna, sertraline, trazodone, and metoprolol tartrate.   Allergies: Patient has No Known Allergies.   Social History:  Social History   Tobacco Use   Smoking status: Never    Passive exposure: Never   Smokeless tobacco: Never  Vaping Use   Vaping Use: Never used  Substance Use Topics   Alcohol use: Never   Drug use: Never    Relationship status: single She lives with her mother.   She is not employed. Regular exercise: No History of abuse: No  Family History:   Family History  Problem Relation Age of Onset   Depression Mother    OCD Mother    Obesity Mother    Asthma Brother    Stroke Maternal Uncle    Depression Maternal Grandmother    Diabetes Paternal Grandmother    Seizures Other    Migraines Neg Hx    Anxiety disorder  Neg Hx    Bipolar disorder Neg Hx    Schizophrenia Neg Hx    ADD / ADHD Neg Hx    Autism Neg Hx      Review of Systems: Review of Systems  Constitutional:  Positive for malaise/fatigue. Negative for fever and weight loss.  Respiratory:  Negative for cough, shortness of breath and wheezing.   Cardiovascular:  Positive for palpitations. Negative for chest pain and leg swelling.  Gastrointestinal:  Negative for abdominal pain and blood in stool.  Genitourinary:  Negative for dysuria.  Musculoskeletal:  Positive for myalgias.  Skin:  Negative for rash.  Neurological:  Positive for headaches. Negative for dizziness.  Endo/Heme/Allergies:  Does not bruise/bleed easily.  Psychiatric/Behavioral:  Positive for depression. The patient is nervous/anxious.      OBJECTIVE Physical Exam: Vitals:   05/22/22 0832  BP: 90/62  Pulse: 88  Weight: 143 lb 3.2 oz (65 kg)  Height: 5' 7.09" (1.704 m)    Physical Exam Constitutional:      General: She is not in acute distress. Pulmonary:     Effort: Pulmonary effort is normal.  Abdominal:     General: There is no distension.     Palpations: Abdomen is soft.     Tenderness: There is no abdominal tenderness. There is no rebound.  Musculoskeletal:        General: No swelling. Normal range of motion.  Skin:    General: Skin is warm and dry.     Findings: No rash.  Neurological:     Mental Status: She is alert and oriented to person, place, and time.  Psychiatric:        Mood and Affect: Mood normal.        Behavior: Behavior normal.      GU / Detailed Urogynecologic Evaluation:  Pelvic Exam: Normal external female genitalia; Bartholin's and Skene's glands normal in appearance; urethral meatus normal in appearance, no urethral masses or discharge.  Patient given a mirror and pointed to prominent hymenal tissue as the source of concern.   CST: negative  White discharge noted at the introitus, aptima swab obtained. Speculum exam reveals  normal vaginal mucosa without atrophy. Cervix normal appearance. Uterus normal single, nontender. Adnexa no mass, fullness, tenderness.    Pelvic floor strength II/V  Pelvic floor musculature: Right levator non-tender, Right obturator non-tender, Left levator non-tender, Left obturator non-tender  POP-Q:   POP-Q  -2.5                                            Aa   -2.5  Ba  -8                                              C   2                                            Gh  4                                            Pb  9                                            tvl   -3                                            Ap  -3                                            Bp  -9                                              D      Rectal Exam:  Normal external rectum  Post-Void Residual (PVR) by Bladder Scan: In order to evaluate bladder emptying, we discussed obtaining a postvoid residual and she agreed to this procedure.  Procedure: The ultrasound unit was placed on the patient's abdomen in the suprapubic region after the patient had voided. A PVR of 144 ml was obtained by bladder scan.  Laboratory Results: Pt unable to leave a urine sample   ASSESSMENT AND PLAN Ms. Bucknam is a 22 y.o. with:  1. Vaginal discharge   2. Urinary frequency    - No evidence of urethral prolapse - Hymenal ring is slightly more prominent and we discussed this may be more noticeable with hormonal changes from OCPs but not a cause for concern.  - Aptima vaginal swab obtained today due to discharge.   Return as needed   Jaquita Folds, MD

## 2022-05-23 LAB — CERVICOVAGINAL ANCILLARY ONLY
Bacterial Vaginitis (gardnerella): NEGATIVE
Candida Glabrata: NEGATIVE
Candida Vaginitis: NEGATIVE
Comment: NEGATIVE
Comment: NEGATIVE
Comment: NEGATIVE

## 2022-05-28 ENCOUNTER — Other Ambulatory Visit (HOSPITAL_COMMUNITY): Payer: Self-pay

## 2022-05-29 ENCOUNTER — Other Ambulatory Visit: Payer: Self-pay

## 2022-06-03 ENCOUNTER — Encounter: Payer: Self-pay | Admitting: Addiction (Substance Use Disorder)

## 2022-06-07 ENCOUNTER — Other Ambulatory Visit (HOSPITAL_COMMUNITY): Payer: Self-pay

## 2022-06-07 ENCOUNTER — Other Ambulatory Visit: Payer: Self-pay

## 2022-06-07 MED ORDER — METOPROLOL TARTRATE 25 MG PO TABS
12.5000 mg | ORAL_TABLET | Freq: Every day | ORAL | 7 refills | Status: DC
  Filled 2022-06-07: qty 45, 90d supply, fill #0

## 2022-06-13 NOTE — Therapy (Signed)
OUTPATIENT PHYSICAL THERAPY FEMALE PELVIC EVALUATION   Patient Name: April Williams MRN: [id number] DOB:2000-12-29, 22 y.o., female Today's Date: 06/14/2022  END OF SESSION:  PT End of Session - 06/14/22 0941     Visit Number 1    Date for PT Re-Evaluation 09/06/22    Authorization Type [facility]    PT Start Time 0845    PT Stop Time 0940    PT Time Calculation (min) 55 min    Activity Tolerance Patient tolerated treatment well    Behavior During Therapy [facility] for tasks assessed/performed             Past Medical History:  Diagnosis Date   Anxiety 06/30/2018   Chronic frontal sinusitis 05/27/2017   Constipation    Depression    Dysautonomia ([place])    Headache    History of influenza 06/05/2017   Hx of chronic sinusitis    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    Irritable bowel syndrome    Nasal polyps    Obsessive-compulsive disorder    Physical deconditioning 04/21/2018   Plantar wart    POTS (postural orthostatic tachycardia syndrome)    Scoliosis of thoracolumbar spine    Vitamin D deficiency    Past Surgical History:  Procedure Laterality Date   NASAL SINUS SURGERY  06/12/2017   wisdom tooth removal     Patient Active Problem List   Diagnosis Date Noted   Encounter for medical examination to establish care 03/21/2022   Ectopic atrial rhythm 06/13/2020   Irritable bowel syndrome    Obsessive compulsive disorder 06/15/2019   Persistent depressive disorder with melancholic features, currently moderate 06/15/2019   Avoidant-restrictive food intake disorder (ARFID) 06/15/2019   Adjustment disorder with mixed anxiety and depressed mood 07/28/2018   Lump of right breast 06/30/2018   Dysautonomia ([place]) 09/02/2017   Adolescent idiopathic scoliosis of thoracolumbar region 08/04/2017   Delayed sleep phase syndrome 08/01/2017   POTS (postural orthostatic tachycardia syndrome) 06/30/2017   Chronic daily headache 06/30/2017   Chronic  pansinusitis 05/27/2017    PCP: Orma Render, NP  REFERRING PROVIDER: Caren Macadam, MD   REFERRING DIAG: K59.04 (ICD-10-CM) - Chronic idiopathic constipation   THERAPY DIAG:  Muscle weakness (generalized)  Unspecified lack of coordination  Right lower quadrant abdominal pain  Rationale for Evaluation and Treatment: Rehabilitation  ONSET DATE: 2021  SUBJECTIVE:  SUBJECTIVE STATEMENT: Patient has had issues with constipation for several years ago but recently having increased flare ups.  Fluid intake: Yes: water, soda    PAIN:  Are you having pain? Yes NPRS scale: 6/10, if rotate her waist to the right 8/10 Pain location:  right lower abdominal  Pain type: aching and dull Pain description: intermittent   Aggravating factors: certain foods, eating, sitting on commode with feet on squatty potty increases rectal pain Relieving factors: occasional enemas   PRECAUTIONS: None  WEIGHT BEARING RESTRICTIONS: No  FALLS:  Has patient fallen in last 6 months? No  LIVING ENVIRONMENT: Lives with: lives with their family   OCCUPATION: student  PLOF: Independent  PATIENT GOALS: reduce pain   PERTINENT HISTORY:  Dysautonomia; IBS; POTS; Scoliosis of Thoracolumbar spine; OCD  BOWEL MOVEMENT: Pain with bowel movement: Yes Type of bowel movement:Type (Bristol Stool Scale) Type 3, 6, 7, Frequency weekly, Strain Yes, and Splinting no Fully empty rectum: No Leakage: No Fiber supplement: stool softener and dulcolax, senna - sees GI in [place]: Pain with urination: No Fully empty bladder: Yes:   Stream: Strong Urgency: No Frequency: average Leakage:  none   INTERCOURSE:no active  PREGNANCY: no pregnancies    PROLAPSE: None   OBJECTIVE:   DIAGNOSTIC  FINDINGS:  Pelvic floor strength II/V  PVR of 144 ml was obtained by bladder scan  No evidence of urethral prolapse Hymenal ring is slightly more prominent    COGNITION: Overall cognitive status: Within functional limits for tasks assessed     SENSATION: Light touch: Appears intact Proprioception: Appears intact   POSTURE:  scoliosis in thoracolumbar area  PELVIC ALIGNMENT:  LUMBARAROM/PROM: full lumbar ROM   LOWER EXTREMITY ROM: Full bilateral hip ROM   LOWER EXTREMITY MMT:  MMT Right eval Left eval  Hip abduction 4/5 4/5   PALPATION:   General  decreased mobility of the lower rib cage, Tightness of diaphragm, tenderness located on the right lower quadrant, lower abdominal tissue has fascial restrictions.                 External Perineal Exam tenderness along the coccygeus, external anal sphincter                             Internal Pelvic Floor tenderness located on the right obturator internist, right iliococcygeus, external and internal anal sphincter; her pelvic floor muscles relax when she brings her knees to chest. Not able to push therapist finger out of the anal canal.   Patient confirms identification and approves PT to assess internal pelvic floor and treatment Yes  PELVIC MMT:   MMT eval  Internal Anal Sphincter 2/5  External Anal Sphincter 2/5  Puborectalis 2/5  (Blank rows = not tested)        TONE: increased   TODAY'S TREATMENT:  DATE: 06/14/22  EVAL see below   PATIENT EDUCATION:  06/14/22 Education details: Access Code: [email or web address]; educated patient on how to sit on a ball and massage the pelvic floor muscles, diaphragmatic breathing while sitting on the ball, toileting, abdominal massage Person educated: Patient Education method: Explanation, Demonstration, Tactile cues, Verbal cues, Handouts, and you tube  video Education comprehension: verbalized understanding, returned demonstration, verbal cues required, tactile cues required, and needs further education  HOME EXERCISE PROGRAM: 06/14/22 Access Code: [email or web address] URL: [email or web address] Date: 06/14/2022 Prepared by: Earlie Counts  Program Notes sit on ball and massage pelvic floor  Exercises - Seated Diaphragmatic Breathing  - 2 x daily - 7 x weekly - 1 sets - 10 reps  ASSESSMENT:  CLINICAL IMPRESSION: Patient is a 22 y.o. female who was seen today for physical therapy evaluation and treatment for constipation. Patient reports issues with constipation for many years but has become progressively worse. She has pain in the right lower quadrant and rectum at  6/10 but  if rotate her waist to the right 8/10. Patient has a bowel movement 1 time per week. She has tenderness located on the right lower quadrant, right levator ani, right obturator internist, external and internal anal sphincter. She has tightness in the sphincter muscles and levator ani and diaphragm. Patient has decreased mobility of the lower rib cage. She will bulge her lower abdomen when contracting. External and puborectalis strength is 2/5. She is not able to push the therapist finger out of the rectum. Her pelvic floor muscles will relax when she bring her knees to her chest. Patient will benefit from skilled therapy to improve coordination and relaxation of her muscles to reduce pain and constipation.   OBJECTIVE IMPAIRMENTS: decreased activity tolerance, decreased endurance, decreased strength, increased fascial restrictions, increased muscle spasms, and pain.   ACTIVITY LIMITATIONS: toileting  PARTICIPATION LIMITATIONS: community activity  PERSONAL FACTORS: Age, Time since onset of injury/illness/exacerbation, and 3+ comorbidities: Dysautonomia; IBS; POTS; Scoliosis of Thoracolumbar spine; OCD  are also affecting patient's functional outcome.   REHAB  POTENTIAL: Excellent  CLINICAL DECISION MAKING: Evolving/moderate complexity  EVALUATION COMPLEXITY: Moderate   GOALS: Goals reviewed with patient? Yes  SHORT TERM GOALS: Target date: 07/11/22  Patient is able to demonstrate correct toileting technique to have a bowel movement.  Baseline: Goal status: INITIAL  2.  Patient understands how to perform diaphragmatic breathing with expansion of her rib cage to relax her pelvic floor.  Baseline:  Goal status: INITIAL  3.  Patient understands how to perform abdominal massage to assist with peristalic motion of the intestines.  Baseline:  Goal status: INITIAL  4.  Patient reports her pain reduced >/= 25% due to reduction of trigger points and increased in muscle relaxation.  Baseline:  Goal status: INITIAL   LONG TERM GOALS: Target date: 09/06/22  Patient is independent with advanced HEP to improve pelvic floor coordination for a bowel movement.  Baseline:  Goal status: INITIAL  2.  Patient is able to contract the lower abdominals equally with the upper abdominals so she is able to generate correct force to push stool out.  Baseline:  Goal status: INITIAL  3.  Patient pelvic floor muscles are relaxed  and reduced trigger points so her pain decreased >/= 75%.  Baseline:  Goal status: INITIAL  4.  Patient is able to have a bowel movement 2 times per week due to improve intestinal mobility and relaxation of the pelvic floor.  Baseline:  Goal status: INITIAL  5.  Patient is able to push the therapist finger out of the rectum due to improve coordination of her pelvic floor.  Baseline:  Goal status: INITIAL    PLAN:  PT FREQUENCY: 1x/week  PT DURATION: 12 weeks  PLANNED INTERVENTIONS: Therapeutic exercises, Therapeutic activity, Neuromuscular re-education, Patient/Family education, Joint mobilization, Dry Needling, Electrical stimulation, Spinal mobilization, Cryotherapy, Moist heat, Taping, Ultrasound, Biofeedback, and  Manual therapy  PLAN FOR NEXT SESSION: manual work on the outside of the anus and along the abdomen, work on lower rib cage mobility, pelvic floor drop, lower abdominal contraction   Earlie Counts, PT 06/14/22 9:59 AM

## 2022-06-14 ENCOUNTER — Other Ambulatory Visit: Payer: Self-pay

## 2022-06-14 ENCOUNTER — Encounter: Payer: Self-pay | Admitting: Physical Therapy

## 2022-06-14 ENCOUNTER — Ambulatory Visit: Payer: 59 | Attending: Family Medicine | Admitting: Physical Therapy

## 2022-06-14 DIAGNOSIS — R279 Unspecified lack of coordination: Secondary | ICD-10-CM | POA: Diagnosis not present

## 2022-06-14 DIAGNOSIS — M6281 Muscle weakness (generalized): Secondary | ICD-10-CM | POA: Diagnosis not present

## 2022-06-14 DIAGNOSIS — R1031 Right lower quadrant pain: Secondary | ICD-10-CM | POA: Insufficient documentation

## 2022-06-14 DIAGNOSIS — K5904 Chronic idiopathic constipation: Secondary | ICD-10-CM | POA: Diagnosis not present

## 2022-06-14 NOTE — Patient Instructions (Signed)

## 2022-07-03 ENCOUNTER — Encounter: Payer: Self-pay | Admitting: Physical Therapy

## 2022-07-03 ENCOUNTER — Ambulatory Visit: Payer: 59 | Attending: Family Medicine | Admitting: Physical Therapy

## 2022-07-03 DIAGNOSIS — R279 Unspecified lack of coordination: Secondary | ICD-10-CM | POA: Insufficient documentation

## 2022-07-03 DIAGNOSIS — R1031 Right lower quadrant pain: Secondary | ICD-10-CM | POA: Insufficient documentation

## 2022-07-03 DIAGNOSIS — M6281 Muscle weakness (generalized): Secondary | ICD-10-CM | POA: Diagnosis not present

## 2022-07-03 NOTE — Therapy (Signed)
OUTPATIENT PHYSICAL THERAPY TREATMENT NOTE   Patient Name: April Williams MRN: [id number] DOB:03/15/2001, 22 y.o., female Today's Date: 07/03/2022  PCP: Tollie Eth, NP  REFERRING PROVIDER: Federico Flake, MD   END OF SESSION:   PT End of Session - 07/03/22 1015     Visit Number 2    Date for PT Re-Evaluation 09/06/22    Authorization Type [facility]    PT Start Time 1015    PT Stop Time 1055    PT Time Calculation (min) 40 min    Activity Tolerance Patient tolerated treatment well    Behavior During Therapy WFL for tasks assessed/performed             Past Medical History:  Diagnosis Date   Anxiety 06/30/2018   Chronic frontal sinusitis 05/27/2017   Constipation    Depression    Dysautonomia    Headache    History of influenza 06/05/2017   Hx of chronic sinusitis    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    Irritable bowel syndrome    Nasal polyps    Obsessive-compulsive disorder    Physical deconditioning 04/21/2018   Plantar wart    POTS (postural orthostatic tachycardia syndrome)    Scoliosis of thoracolumbar spine    Vitamin D deficiency    Past Surgical History:  Procedure Laterality Date   NASAL SINUS SURGERY  06/12/2017   wisdom tooth removal     Patient Active Problem List   Diagnosis Date Noted   Encounter for medical examination to establish care 03/21/2022   Ectopic atrial rhythm 06/13/2020   Irritable bowel syndrome    Obsessive compulsive disorder 06/15/2019   Persistent depressive disorder with melancholic features, currently moderate 06/15/2019   Avoidant-restrictive food intake disorder (ARFID) 06/15/2019   Adjustment disorder with mixed anxiety and depressed mood 07/28/2018   Lump of right breast 06/30/2018   Dysautonomia 09/02/2017   Adolescent idiopathic scoliosis of thoracolumbar region 08/04/2017   Delayed sleep phase syndrome 08/01/2017   POTS (postural orthostatic tachycardia syndrome) 06/30/2017    Chronic daily headache 06/30/2017   Chronic pansinusitis 05/27/2017   REFERRING DIAG: K59.04 (ICD-10-CM) - Chronic idiopathic constipation    THERAPY DIAG:  Muscle weakness (generalized)   Unspecified lack of coordination   Right lower quadrant abdominal pain   Rationale for Evaluation and Treatment: Rehabilitation   ONSET DATE: 2021   SUBJECTIVE:  SUBJECTIVE STATEMENT: I am able to control my stomach better and have more strength. I am on my cycle right now.  Fluid intake: Yes: water, soda     PAIN:  Are you having pain? Yes NPRS scale: 6/10, if rotate her waist to the right 8/10 Pain location:  right lower abdominal   Pain type: aching and dull Pain description: intermittent    Aggravating factors: certain foods, eating, sitting on commode with feet on squatty potty increases rectal pain Relieving factors: occasional enemas     PRECAUTIONS: None   WEIGHT BEARING RESTRICTIONS: No   FALLS:  Has patient fallen in last 6 months? No   LIVING ENVIRONMENT: Lives with: lives with their family     OCCUPATION: student   PLOF: Independent   PATIENT GOALS: reduce pain    PERTINENT HISTORY:  Dysautonomia; IBS; POTS; Scoliosis of Thoracolumbar spine; OCD   BOWEL MOVEMENT: Pain with bowel movement: Yes Type of bowel movement:Type (Bristol Stool Scale) Type 3, 6, 7, Frequency weekly, Strain Yes, and Splinting no Fully empty rectum: No Leakage: No Fiber supplement: stool softener and dulcolax, senna - sees GI in [place]    URINATION: Pain with urination: No Fully empty bladder: Yes:   Stream: Strong Urgency: No Frequency: average Leakage:  none     INTERCOURSE:no active   PREGNANCY: no pregnancies       PROLAPSE: None     OBJECTIVE:    DIAGNOSTIC FINDINGS:   Pelvic floor strength II/V  PVR of 144 ml was obtained by bladder scan  No evidence of urethral prolapse Hymenal ring is slightly more prominent      COGNITION: Overall cognitive status: Within functional limits for tasks assessed                          SENSATION: Light touch: Appears intact Proprioception: Appears intact     POSTURE:  scoliosis in thoracolumbar area   PELVIC ALIGNMENT:   LUMBARAROM/PROM: full lumbar ROM     LOWER EXTREMITY ROM: Full bilateral hip ROM     LOWER EXTREMITY MMT:   MMT Right eval Left eval  Hip abduction 4/5 4/5    PALPATION:   General  decreased mobility of the lower rib cage, Tightness of diaphragm, tenderness located on the right lower quadrant, lower abdominal tissue has fascial restrictions.                  External Perineal Exam tenderness along the coccygeus, external anal sphincter                             Internal Pelvic Floor tenderness located on the right obturator internist, right iliococcygeus, external and internal anal sphincter; her pelvic floor muscles relax when she brings her knees to chest. Not able to push therapist finger out of the anal canal.    Patient confirms identification and approves PT to assess internal pelvic floor and treatment Yes   PELVIC MMT:   MMT eval  Internal Anal Sphincter 2/5  External Anal Sphincter 2/5  Puborectalis 2/5  (Blank rows = not tested)         TONE: increased     TODAY'S TREATMENT:  07/03/22 Manual: Soft tissue mobilization: Manual work to the diaphragm in supine and sidely working with breath Circular massage to the abdomen to improve peristalic motion of the intestines Myofascial release: Using the suction cup to the abdomen  and rib cage to release the tissue Tissue rolling along the abdomen and sides of the lower rib cage Release along the umbilicus with leg movement Release of the lower abdominal area to improve the tissue  mobilty Exercises: Stretches/mobility: Laying on ball under the ribs to open the top rib cage Open book to increase thoracic rotation Diaphragmatic breathing in supine    PATIENT EDUCATION:  07/03/22 Education details: Access Code: [email or web address]; educated patient on how to sit on a ball and massage the pelvic floor muscles, diaphragmatic breathing while sitting on the ball, toileting, abdominal massage Person educated: Patient Education method: Explanation, Demonstration, Tactile cues, Verbal cues, Handouts, and you tube video Education comprehension: verbalized understanding, returned demonstration, verbal cues required, tactile cues required, and needs further education   HOME EXERCISE PROGRAM: 07/03/22 Access Code: [email or web address] URL: [email or web address] Date: 07/03/2022 Prepared by: Eulis Foster  Program Notes sit on ball and massage pelvic floor  Exercises - Seated Diaphragmatic Breathing  - 2 x daily - 7 x weekly - 1 sets - 10 reps - Sidelying Thoracic Rotation with Open Book  - 1 x daily - 7 x weekly - 3 sets - 10 reps - Thoracic Sidebending with Towel Roll  - 1 x daily - 7 x weekly - 1 sets - 10 reps    ASSESSMENT:   CLINICAL IMPRESSION: Patient is a 22 y.o. female who was seen today for physical therapy  treatment for constipation. Patient has tightness in the abdomen and diaphragm. She felt more open after the manual work. She has difficulty with opening of the lower rib cage. Patient had increased fascial release of the lower abdomen and umbilicus. Patient will benefit from skilled therapy to improve coordination and relaxation of her muscles to reduce pain and constipation.    OBJECTIVE IMPAIRMENTS: decreased activity tolerance, decreased endurance, decreased strength, increased fascial restrictions, increased muscle spasms, and pain.    ACTIVITY LIMITATIONS: toileting   PARTICIPATION LIMITATIONS: community activity   PERSONAL FACTORS: Age, Time since onset of  injury/illness/exacerbation, and 3+ comorbidities: Dysautonomia; IBS; POTS; Scoliosis of Thoracolumbar spine; OCD  are also affecting patient's functional outcome.    REHAB POTENTIAL: Excellent   CLINICAL DECISION MAKING: Evolving/moderate complexity   EVALUATION COMPLEXITY: Moderate     GOALS: Goals reviewed with patient? Yes   SHORT TERM GOALS: Target date: 07/11/22   Patient is able to demonstrate correct toileting technique to have a bowel movement.  Baseline: Goal status: INITIAL   2.  Patient understands how to perform diaphragmatic breathing with expansion of her rib cage to relax her pelvic floor.  Baseline:  Goal status: INITIAL   3.  Patient understands how to perform abdominal massage to assist with peristalic motion of the intestines.  Baseline:  Goal status: INITIAL   4.  Patient reports her pain reduced >/= 25% due to reduction of trigger points and increased in muscle relaxation.  Baseline:  Goal status: INITIAL     LONG TERM GOALS: Target date: 09/06/22   Patient is independent with advanced HEP to improve pelvic floor coordination for a bowel movement.  Baseline:  Goal status: INITIAL   2.  Patient is able to contract the lower abdominals equally with the upper abdominals so she is able to generate correct force to push stool out.  Baseline:  Goal status: INITIAL   3.  Patient pelvic floor muscles are relaxed  and reduced trigger points so her pain decreased >/= 75%.  Baseline:  Goal status: INITIAL   4.  Patient is able to have a bowel movement 2 times per week due to improve intestinal mobility and relaxation of the pelvic floor.  Baseline:  Goal status: INITIAL   5.  Patient is able to push the therapist finger out of the rectum due to improve coordination of her pelvic floor.  Baseline:  Goal status: INITIAL       PLAN:   PT FREQUENCY: 1x/week   PT DURATION: 12 weeks   PLANNED INTERVENTIONS: Therapeutic exercises, Therapeutic activity,  Neuromuscular re-education, Patient/Family education, Joint mobilization, Dry Needling, Electrical stimulation, Spinal mobilization, Cryotherapy, Moist heat, Taping, Ultrasound, Biofeedback, and Manual therapy   PLAN FOR NEXT SESSION: manual work on the outside of the anus and along the abdomen, work on lower rib cage mobility, pelvic floor drop, lower abdominal contraction, work on quadratus, see how the meditation is going,   Eulis FosterCheryl Khaiden Segreto, PT 07/03/22 11:02 AM

## 2022-07-18 ENCOUNTER — Ambulatory Visit (INDEPENDENT_AMBULATORY_CARE_PROVIDER_SITE_OTHER): Payer: 59 | Admitting: Adult Health

## 2022-07-18 DIAGNOSIS — Z0389 Encounter for observation for other suspected diseases and conditions ruled out: Secondary | ICD-10-CM

## 2022-07-18 NOTE — Progress Notes (Signed)
Patient no show appointment. ? ?

## 2022-07-19 DIAGNOSIS — G901 Familial dysautonomia [Riley-Day]: Secondary | ICD-10-CM | POA: Diagnosis not present

## 2022-07-19 DIAGNOSIS — R5382 Chronic fatigue, unspecified: Secondary | ICD-10-CM | POA: Diagnosis not present

## 2022-07-22 ENCOUNTER — Other Ambulatory Visit (HOSPITAL_COMMUNITY): Payer: Self-pay

## 2022-07-30 ENCOUNTER — Ambulatory Visit (INDEPENDENT_AMBULATORY_CARE_PROVIDER_SITE_OTHER): Payer: 59 | Admitting: Adult Health

## 2022-07-30 ENCOUNTER — Encounter: Payer: Self-pay | Admitting: Adult Health

## 2022-07-30 DIAGNOSIS — F5082 Avoidant/restrictive food intake disorder: Secondary | ICD-10-CM

## 2022-07-30 DIAGNOSIS — F411 Generalized anxiety disorder: Secondary | ICD-10-CM

## 2022-07-30 DIAGNOSIS — F341 Dysthymic disorder: Secondary | ICD-10-CM | POA: Diagnosis not present

## 2022-07-30 DIAGNOSIS — F422 Mixed obsessional thoughts and acts: Secondary | ICD-10-CM | POA: Diagnosis not present

## 2022-07-30 NOTE — Progress Notes (Signed)
April Williams [id number] Feb 01, 2001 22 y.o.  Virtual Visit via Telephone Note  I connected with pt on 07/30/22 at  8:20 AM EDT by telephone and verified that I am speaking with the correct person using two identifiers.   I discussed the limitations, risks, security and privacy concerns of performing an evaluation and management service by telephone and the availability of in person appointments. I also discussed with the patient that there may be a patient responsible charge related to this service. The patient expressed understanding and agreed to proceed.   I discussed the assessment and treatment plan with the patient. The patient was provided an opportunity to ask questions and all were answered. The patient agreed with the plan and demonstrated an understanding of the instructions.   The patient was advised to call back or seek an in-person evaluation if the symptoms worsen or if the condition fails to improve as anticipated.  I provided 25 minutes of non-face-to-face time during this encounter.  The patient was located at home.  The provider was located at [facility].   Dorothyann Gibbs, NP   Subjective:   Patient ID:  April Williams is a 22 y.o. (DOB January 04, 2001) female.  Chief Complaint: No chief complaint on file.   HPI April Williams presents for follow-up of persistent depressive disorder with melancholic features, currently moderate, Mixed obsessional thoughts and acts, and avoidant-restrictive food intake disorder (ARFID).  Describes mood today as "ok". Pleasant. Denies tearfulness. Mood symptoms - denies depression, anxiety, and irritability. Denies worry, rumination and over thinking. Mood is consistent. Stating "I'm doing pretty good". Feels like medications work well. Stable interest and motivation. Taking medications as prescribed.  Energy levels pretty good. Has a regular exercise routine. Enjoys some usual interests and activities. Single. Dating.  Lives with mother - hedgehog Vikki Ports) Sister lives 1.5 hours away - niece 54 years old and nephew - a few months old. Father and brother lives in [place]. Spending time with family. Appetite adequate. Weight stable. Sleeps well most nights with Trazadone. Averages 8 to 10 hours.  Focus and concentration stable. Completing some household tasks. Managing aspects of household. Taking classes at a community college - [employer]. Plans to take summer classes.  Denies SI or HI.  Denies AH or VH. Denies self harm. Denies substance use.  Previous medication trials: Pristiq    Review of Systems:  Review of Systems  Musculoskeletal:  Negative for gait problem.  Neurological:  Negative for tremors.  Psychiatric/Behavioral:         Please refer to HPI    Medications: I have reviewed the patient's current medications.  Current Outpatient Medications  Medication Sig Dispense Refill   ARIPiprazole (ABILIFY) 2 MG tablet Take 1 tablet (2 mg total) by mouth daily. 90 tablet 3   cholecalciferol (VITAMIN D) 1000 units tablet Take 4,000 Units by mouth daily.     ferrous sulfate 324 (65 Fe) MG TBEC Take 1 tablet by mouth in the morning and 1 tablet in the evening with meals. If causes constipation, begin miralax daily as needed. Check Hgb/ferritin in 3 months.. 90 tablet 3   fludrocortisone (FLORINEF) 0.1 MG tablet Take 0.2 mg by mouth daily.     Magnesium Hydroxide 1200 MG CHEW Chew 1,200 mg by mouth daily.     Melatonin 10 MG TABS Take by mouth.     metoprolol tartrate (LOPRESSOR) 25 MG tablet TAKE 1 TABLET BY MOUTH ONCE A DAY 90 tablet 2   metoprolol tartrate (  LOPRESSOR) 25 MG tablet Take 0.5 tablets (12.5 mg total) by mouth daily. 90 tablet 7   midodrine (PROAMATINE) 10 MG tablet Take 10 mg by mouth 3 (three) times daily.     norethindrone (MICRONOR) 0.35 MG tablet Take 1 tablet (0.35 mg total) by mouth daily. 84 tablet 5   senna (SENOKOT) 8.6 MG tablet Take 2 tablets by mouth daily.     sertraline  (ZOLOFT) 100 MG tablet Take 2 tablets (200 mg total) by mouth daily. 180 tablet 3   traZODone (DESYREL) 50 MG tablet Take 1 tablet (50 mg total) by mouth at bedtime. 90 tablet 3   No current facility-administered medications for this visit.    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    Irritable bowel syndrome    Nasal polyps    Obsessive-compulsive disorder    Physical deconditioning 04/21/2018   Plantar wart    POTS (postural orthostatic tachycardia syndrome)    Scoliosis of thoracolumbar spine    Vitamin D deficiency     Family History  Problem Relation Age of Onset   Depression Mother    OCD Mother    Obesity Mother    Asthma Brother    Stroke Maternal Uncle    Depression Maternal Grandmother    Diabetes Paternal Grandmother    Seizures Other    Migraines Neg Hx    Anxiety disorder Neg Hx    Bipolar disorder Neg Hx    Schizophrenia Neg Hx    ADD / ADHD Neg Hx    Autism Neg Hx     Social History   Socioeconomic History   Marital status: Single    Spouse name: Not on file   Number of children: Not on file   Years of education: Not on file   Highest education level: 9th grade  Occupational History   Occupation: student  Tobacco Use   Smoking status: Never    Passive exposure: Never   Smokeless tobacco: Never  Vaping Use   Vaping Use: Never used  Substance and Sexual Activity   Alcohol use: Never   Drug use: Never   Sexual activity: Never  Other Topics Concern   Not on file  Social History Narrative   Lexianna is at [employer] for dual enrollment with an associates in Retail buyer. She would like to be an Transport planner.       She lives with  mother and goes to her father's house every other weekend. She has a Cat and a Hedgehog   Has been primarily with mother since Covid 19 restrictions.    She enjoys playing video games, sing, and sew.          06/15/2019: Self-directed in schoolwork generally A and B grades currently taking AP psychology planning considering herself undeserving for low grade or delayed assignment completion which is always B or better than average.  Patient wishes to have dual educational program next year in 11th grade with classes also at [employer]. She has been inflexible and constricted in repertoire of nutrition and activity, though she can spontaneously enjoy social activity with friends as recent as a  year ago as well as volunteering for special needs children to have Friday night activities before COVID.  Otherwise she has maintained only 1 friend though she enjoys the cat at home though having to clean after it.  Patient feels tormented by older sister who has a 53-month-old baby patient loves as with father or psychologist wanting her to gain weight to become healthy.   Social Determinants of Health   Financial Resource Strain: Not on file  Food Insecurity: No Food Insecurity (09/20/2021)   Hunger Vital Sign    Worried About Running Out of Food in the Last Year: Never true    Ran Out of Food in the Last Year: Never true  Transportation Needs: No Transportation Needs (09/20/2021)   PRAPARE - Administrator, Civil Service (Medical): No    Lack of Transportation (Non-Medical): No  Physical Activity: Not on file  Stress: Not on file  Social Connections: Not on file  Intimate Partner Violence: Not on file    Past Medical History, Surgical history, Social history, and Family history were reviewed and updated as appropriate.   Please see review of systems for further details on the patient's review from today.   Objective:   Physical Exam:  There were no vitals taken for this visit.  Physical  Exam Constitutional:      General: She is not in acute distress. Musculoskeletal:        General: No deformity.  Neurological:     Mental Status: She is alert and oriented to person, place, and time.     Coordination: Coordination normal.  Psychiatric:        Attention and Perception: Attention and perception normal. She does not perceive auditory or visual hallucinations.        Mood and Affect: Mood normal. Mood is not anxious or depressed. Affect is not labile, blunt, angry or inappropriate.        Speech: Speech normal.        Behavior: Behavior normal.        Thought Content: Thought content normal. Thought content is not paranoid or delusional. Thought content does not include homicidal or suicidal ideation. Thought content does not include homicidal or suicidal plan.        Cognition and Memory: Cognition and memory normal.        Judgment: Judgment normal.     Comments: Insight intact     Lab Review:     Component Value Date/Time   NA 136 01/27/2019 1015   K 4.9 01/27/2019 1015   CL 105 01/27/2019 1015   CO2 22 01/27/2019 1015   GLUCOSE 91 01/27/2019 1015   BUN 9 01/27/2019 1015   CREATININE 0.70 01/27/2019 1015   CALCIUM 9.9 01/27/2019 1015   PROT 7.2 01/27/2019 1015   ALBUMIN 4.1 12/07/2016 1402   AST 13 01/27/2019 1015   ALT 10 01/27/2019 1015   ALKPHOS 117 12/07/2016 1402   BILITOT 0.7 01/27/2019 1015   GFRNONAA NOT CALCULATED 12/07/2016 1402   GFRAA NOT CALCULATED 12/07/2016 1402       Component Value Date/Time   WBC 8.8 12/07/2016 1402   RBC 5.53 (H) 12/07/2016 1402   HGB 15.4 (H) 12/07/2016 1402   HCT 44.3 (H) 12/07/2016 1402   PLT 424 (H) 12/07/2016 1402   MCV 80.1 12/07/2016 1402   MCH 27.8 12/07/2016 1402   MCHC 34.8 12/07/2016 1402   RDW 12.0 12/07/2016 1402   LYMPHSABS 2.0 12/07/2016 1402   MONOABS  1.0 12/07/2016 1402   EOSABS 0.1 12/07/2016 1402   BASOSABS 0.0 12/07/2016 1402    No results found for: "POCLITH", "LITHIUM"   No results  found for: "PHENYTOIN", "PHENOBARB", "VALPROATE", "CBMZ"   .res Assessment: Plan:    Plan:  PDMP reviewed  1. Zoloft 200mg  daily 2. Abilify 2mg  daily 3. Trazadone 25mg  at hs    RTC 6 months  Discussed potential metabolic side effects associated with atypical antipsychotics, as well as potential risk for movement side effects. Advised pt to contact office if movement side effects occur.    There are no diagnoses linked to this encounter.  Please see After Visit Summary for patient specific instructions.  Future Appointments  Date Time Provider Department Center  07/30/2022  8:20 AM See Beharry, Thereasa Solo, NP [facility] None  08/02/2022 10:15 AM Theressa Millard, PT [facility] None  09/09/2022  8:45 AM Theressa Millard, PT [facility] None  09/20/2022  8:45 AM Theressa Millard, PT [facility] None  09/23/2022 12:30 PM Theressa Millard, PT [facility] None    No orders of the defined types were placed in this encounter.     -------------------------------

## 2022-08-02 ENCOUNTER — Encounter: Payer: Self-pay | Admitting: Physical Therapy

## 2022-08-02 ENCOUNTER — Ambulatory Visit: Payer: 59 | Attending: Family Medicine | Admitting: Physical Therapy

## 2022-08-02 DIAGNOSIS — R279 Unspecified lack of coordination: Secondary | ICD-10-CM | POA: Diagnosis not present

## 2022-08-02 DIAGNOSIS — R1031 Right lower quadrant pain: Secondary | ICD-10-CM | POA: Insufficient documentation

## 2022-08-02 DIAGNOSIS — M6281 Muscle weakness (generalized): Secondary | ICD-10-CM | POA: Insufficient documentation

## 2022-08-02 NOTE — Therapy (Signed)
OUTPATIENT PHYSICAL THERAPY TREATMENT NOTE   Patient Name: April Williams MRN: [id number] DOB:April 09, 2000, 22 y.o., female Today's Date: 08/02/2022  PCP: Tollie Eth, NP  REFERRING PROVIDER:  Federico Flake, MD   END OF SESSION:   PT End of Session - 08/02/22 1016     Visit Number 3    Date for PT Re-Evaluation 11/29/22    Authorization Type [facility]    PT Start Time 1015    PT Stop Time 1055    PT Time Calculation (min) 40 min    Activity Tolerance Patient tolerated treatment well    Behavior During Therapy [facility] for tasks assessed/performed             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    Irritable bowel syndrome    Nasal polyps    Obsessive-compulsive disorder    Physical deconditioning 04/21/2018   Plantar wart    POTS (postural orthostatic tachycardia syndrome)    Scoliosis of thoracolumbar spine    Vitamin D deficiency    Past Surgical History:  Procedure Laterality Date   NASAL SINUS SURGERY  06/12/2017   wisdom tooth removal     Patient Active Problem List   Diagnosis Date Noted   Encounter for medical examination to establish care 03/21/2022   Ectopic atrial rhythm 06/13/2020   Irritable bowel syndrome    Obsessive compulsive disorder 06/15/2019   Persistent depressive disorder with melancholic features, currently moderate 06/15/2019   Avoidant-restrictive food intake disorder (ARFID) 06/15/2019   Adjustment disorder with mixed anxiety and depressed mood 07/28/2018   Lump of right breast 06/30/2018   Dysautonomia (HCC) 09/02/2017   Adolescent idiopathic scoliosis of thoracolumbar region 08/04/2017   Delayed sleep phase syndrome 08/01/2017   POTS (postural orthostatic tachycardia  syndrome) 06/30/2017   Chronic daily headache 06/30/2017   Chronic pansinusitis 05/27/2017   REFERRING DIAG: K59.04 (ICD-10-CM) - Chronic idiopathic constipation    THERAPY DIAG:  Muscle weakness (generalized)   Unspecified lack of coordination   Right lower quadrant abdominal pain   Rationale for Evaluation and Treatment: Rehabilitation   ONSET DATE: 2021   SUBJECTIVE:  SUBJECTIVE STATEMENT: I feel more strong in core and abdomen. Pain is 20% better.      PAIN:  Are you having pain? Yes NPRS scale: 4/10, if rotate her waist to the right 8/10 Pain location:  right lower abdominal   Pain type: aching and dull Pain description: intermittent    Aggravating factors: certain foods, eating, sitting on commode with feet on squatty potty increases rectal pain Relieving factors: occasional enemas     PRECAUTIONS: None   WEIGHT BEARING RESTRICTIONS: No   FALLS:  Has patient fallen in last 6 months? No   LIVING ENVIRONMENT: Lives with: lives with their family     OCCUPATION: student   PLOF: Independent   PATIENT GOALS: reduce pain    PERTINENT HISTORY:  Dysautonomia; IBS; POTS; Scoliosis of Thoracolumbar spine; OCD   BOWEL MOVEMENT: Pain with bowel movement: Yes Type of bowel movement:Type (Bristol Stool Scale) Type 3, 6, 7, Frequency weekly, Strain Yes, and Splinting no Fully empty rectum: No Leakage: No Fiber supplement: stool softener and dulcolax, senna - sees GI in [place]    URINATION: Pain with urination: No Fully empty bladder: Yes:   Stream: Strong Urgency: No Frequency: average Leakage:  none     INTERCOURSE:no active   PREGNANCY: no pregnancies       PROLAPSE: None     OBJECTIVE:    DIAGNOSTIC FINDINGS:  Pelvic floor strength II/V  PVR of 144 ml  was obtained by bladder scan  No evidence of urethral prolapse Hymenal ring is slightly more prominent      COGNITION: Overall cognitive status: Within functional limits for tasks assessed                          SENSATION: Light touch: Appears intact Proprioception: Appears intact     POSTURE:  scoliosis in thoracolumbar area   PELVIC ALIGNMENT:   LUMBARAROM/PROM: full lumbar ROM     LOWER EXTREMITY ROM: Full bilateral hip ROM     LOWER EXTREMITY MMT:   MMT Right eval Left eval Right/left  08/02/22  Hip abduction 4/5 4/5 4/5    PALPATION:   General  decreased mobility of the lower rib cage, Tightness of diaphragm, tenderness located on the right lower quadrant, lower abdominal tissue has fascial restrictions.                  External Perineal Exam tenderness along the coccygeus, external anal sphincter                             Internal Pelvic Floor tenderness located on the right obturator internist, right iliococcygeus, external and internal anal sphincter; her pelvic floor muscles relax when she brings her knees to chest. Not able to push therapist finger out of the anal canal.    Patient confirms identification and approves PT to assess internal pelvic floor and treatment Yes   PELVIC MMT:   MMT eval  Internal Anal Sphincter 2/5  External Anal Sphincter 2/5  Puborectalis 2/5  (Blank rows = not tested)         TONE: increased     TODAY'S TREATMENT:  08/02/22 Manual: Myofascial release: Using the suction cup to the lower abdominal and around the rib cage to open up the ribs and reduce the fascial tightness.  Neuromuscular re-education: Pelvic floor contraction training: Using the RUSI to train the pelvic floor to relax with diaphragmatic  breathing and during breathing out to push stool out utilizing maximal verbal and visual cueing to train the activity. Patient was in supine using the transperineal setting.  Tried the breath with ahhh and then like  she is blowing out candles to expand the rectum with blowing out candles working best Tactile cues to the lateral lower rib cage to expand with diaphragmatic breathing to perform pelvic drop  07/03/22 Manual: Soft tissue mobilization: Manual work to the diaphragm in supine and sidely working with breath Circular massage to the abdomen to improve peristalic motion of the intestines Myofascial release: Using the suction cup to the abdomen and rib cage to release the tissue Tissue rolling along the abdomen and sides of the lower rib cage Release along the umbilicus with leg movement Release of the lower abdominal area to improve the tissue mobilty Exercises: Stretches/mobility: Laying on ball under the ribs to open the top rib cage Open book to increase thoracic rotation Diaphragmatic breathing in supine    PATIENT EDUCATION:  07/03/22 Education details: Access Code: [email or web address]; educated patient on how to sit on a ball and massage the pelvic floor muscles, diaphragmatic breathing while sitting on the ball, toileting, abdominal massage Person educated: Patient Education method: Explanation, Demonstration, Tactile cues, Verbal cues, Handouts, and you tube video Education comprehension: verbalized understanding, returned demonstration, verbal cues required, tactile cues required, and needs further education   HOME EXERCISE PROGRAM: 07/03/22 Access Code: [email or web address] URL: [email or web address] Date: 07/03/2022 Prepared by: Eulis Foster   Program Notes sit on ball and massage pelvic floor   Exercises - Seated Diaphragmatic Breathing  - 2 x daily - 7 x weekly - 1 sets - 10 reps - Sidelying Thoracic Rotation with Open Book  - 1 x daily - 7 x weekly - 3 sets - 10 reps - Thoracic Sidebending with Towel Roll  - 1 x daily - 7 x weekly - 1 sets - 10 reps     ASSESSMENT:   CLINICAL IMPRESSION: Patient is a 22 y.o. female who was seen today for physical therapy  treatment for  constipation. Right abdominal pain is 4/10 compared to 6/10. Reports her abdominal pain is 20% better.  Patient using the meditation and is more relaxed. Patient was able to perform pelvic floor drop by the end of treatment using the RUSI. She was able to keep the pelvic floor open with breath.  Patient will benefit from skilled therapy to improve coordination and relaxation of her muscles to reduce pain and constipation.    OBJECTIVE IMPAIRMENTS: decreased activity tolerance, decreased endurance, decreased strength, increased fascial restrictions, increased muscle spasms, and pain.    ACTIVITY LIMITATIONS: toileting   PARTICIPATION LIMITATIONS: community activity   PERSONAL FACTORS: Age, Time since onset of injury/illness/exacerbation, and 3+ comorbidities: Dysautonomia; IBS; POTS; Scoliosis of Thoracolumbar spine; OCD  are also affecting patient's functional outcome.    REHAB POTENTIAL: Excellent   CLINICAL DECISION MAKING: Evolving/moderate complexity   EVALUATION COMPLEXITY: Moderate     GOALS: Goals reviewed with patient? Yes   SHORT TERM GOALS: Target date: 07/11/22   Patient is able to demonstrate correct toileting technique to have a bowel movement.  Baseline: still working on the breath work Goal status: ongoing 08/02/22   2.  Patient understands how to perform diaphragmatic breathing with expansion of her rib cage to relax her pelvic floor.  Baseline:  Goal status: Met 08/02/22   3.  Patient understands how to perform abdominal massage to assist with peristalic motion of the intestines.  Baseline:  Goal status: Met 08/02/22   4.  Patient reports her pain reduced >/= 25% due to reduction of trigger points and increased in muscle relaxation.  Baseline: decreased by 20% Goal status: ongoing 08/02/22     LONG TERM GOALS: Target date: 09/06/22   Patient is independent with advanced HEP to improve pelvic floor coordination for a bowel movement.  Baseline:  Goal status:  INITIAL   2.  Patient is able to contract the lower abdominals equally with the upper abdominals so she is able to generate correct force to push stool out.  Baseline:  Goal status: INITIAL   3.  Patient pelvic floor muscles are relaxed  and reduced trigger points so her pain decreased >/= 75%.  Baseline:  Goal status: INITIAL   4.  Patient is able to have a bowel movement 2 times per week due to improve intestinal mobility and relaxation of the pelvic floor.  Baseline:  Goal status: INITIAL   5.  Patient is able to push the therapist finger out of the rectum due to improve coordination of her pelvic floor.  Baseline:  Goal status: INITIAL       PLAN:   PT FREQUENCY: 1x/week   PT DURATION: 12 weeks   PLANNED INTERVENTIONS: Therapeutic exercises, Therapeutic activity, Neuromuscular re-education, Patient/Family education, Joint mobilization, Dry Needling, Electrical stimulation, Spinal mobilization, Cryotherapy, Moist heat, Taping, Ultrasound, Biofeedback, and Manual therapy   PLAN FOR NEXT SESSION: manual work on the outside of the anus and along the abdomen, work on lower rib cage mobility,  lower abdominal contraction, work on [employer], PT 08/02/22 11:59 AM

## 2022-08-09 ENCOUNTER — Telehealth: Payer: Self-pay | Admitting: Adult Health

## 2022-08-09 ENCOUNTER — Other Ambulatory Visit (HOSPITAL_COMMUNITY): Payer: Self-pay

## 2022-08-09 NOTE — Telephone Encounter (Signed)
Patient was notified.

## 2022-08-09 NOTE — Telephone Encounter (Signed)
I called pt for follow up appt at 4:24p.  She is requesting refill for Zoloft, Trazadone and Aripiprazole to [facility].   Next appt 11/18

## 2022-08-09 NOTE — Telephone Encounter (Signed)
Patient has RF of all requested medications at the pharmacy. Abilify can be filled now, Zoloft not due until the end of June, and trazodone the end of May.

## 2022-08-16 ENCOUNTER — Encounter: Payer: 59 | Admitting: Physical Therapy

## 2022-08-17 ENCOUNTER — Other Ambulatory Visit (HOSPITAL_COMMUNITY): Payer: Self-pay

## 2022-08-30 ENCOUNTER — Encounter: Payer: 59 | Admitting: Physical Therapy

## 2022-09-09 ENCOUNTER — Ambulatory Visit: Payer: 59 | Attending: Family Medicine | Admitting: Physical Therapy

## 2022-09-09 ENCOUNTER — Encounter: Payer: Self-pay | Admitting: Physical Therapy

## 2022-09-09 DIAGNOSIS — R279 Unspecified lack of coordination: Secondary | ICD-10-CM | POA: Diagnosis not present

## 2022-09-09 DIAGNOSIS — R1031 Right lower quadrant pain: Secondary | ICD-10-CM | POA: Diagnosis not present

## 2022-09-09 DIAGNOSIS — M6281 Muscle weakness (generalized): Secondary | ICD-10-CM | POA: Diagnosis not present

## 2022-09-09 NOTE — Therapy (Signed)
OUTPATIENT PHYSICAL THERAPY TREATMENT NOTE   Patient Name: April Williams MRN: [id number] DOB:2000-11-09, 22 y.o., female Today's Date: 09/09/2022  PCP: Tollie Eth, NP  REFERRING PROVIDER:  Federico Flake, MD   END OF SESSION:   PT End of Session - 09/09/22 0848     Visit Number 4    Date for PT Re-Evaluation 11/29/22    Authorization Type [facility]    PT Start Time 0845    PT Stop Time 0925    PT Time Calculation (min) 40 min    Behavior During Therapy [facility] for tasks assessed/performed             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    Irritable bowel syndrome    Nasal polyps    Obsessive-compulsive disorder    Physical deconditioning 04/21/2018   Plantar wart    POTS (postural orthostatic tachycardia syndrome)    Scoliosis of thoracolumbar spine    Vitamin D deficiency    Past Surgical History:  Procedure Laterality Date   NASAL SINUS SURGERY  06/12/2017   wisdom tooth removal     Patient Active Problem List   Diagnosis Date Noted   Encounter for medical examination to establish care 03/21/2022   Ectopic atrial rhythm 06/13/2020   Irritable bowel syndrome    Obsessive compulsive disorder 06/15/2019   Persistent depressive disorder with melancholic features, currently moderate 06/15/2019   Avoidant-restrictive food intake disorder (ARFID) 06/15/2019   Adjustment disorder with mixed anxiety and depressed mood 07/28/2018   Lump of right breast 06/30/2018   Dysautonomia (HCC) 09/02/2017   Adolescent idiopathic scoliosis of thoracolumbar region 08/04/2017   Delayed sleep phase syndrome 08/01/2017   POTS (postural orthostatic tachycardia syndrome) 06/30/2017   Chronic daily headache 06/30/2017    Chronic pansinusitis 05/27/2017   REFERRING DIAG: K59.04 (ICD-10-CM) - Chronic idiopathic constipation    THERAPY DIAG:  Muscle weakness (generalized)   Unspecified lack of coordination   Right lower quadrant abdominal pain   Rationale for Evaluation and Treatment: Rehabilitation   ONSET DATE: 2021   SUBJECTIVE:                                                                                                                                                                                            SUBJECTIVE STATEMENT:  I am considering endometriosis due to the increased pain.I had 2 period per month this month.  I feel like I am emptying my bladder better.       PAIN:  Are you having pain? Yes NPRS scale: 1/10, if rotate her waist to the right 8/10 Pain location:  right lower abdominal   Pain type: aching and dull Pain description: intermittent    Aggravating factors: certain foods, eating, sitting on commode with feet on squatty potty increases rectal pain Relieving factors: occasional enemas     PRECAUTIONS: None   WEIGHT BEARING RESTRICTIONS: No   FALLS:  Has patient fallen in last 6 months? No   LIVING ENVIRONMENT: Lives with: lives with their family     OCCUPATION: student   PLOF: Independent   PATIENT GOALS: reduce pain    PERTINENT HISTORY:  Dysautonomia; IBS; POTS; Scoliosis of Thoracolumbar spine; OCD   BOWEL MOVEMENT: Pain with bowel movement: Yes Type of bowel movement:Type (Bristol Stool Scale) Type 3, 6, 7, Frequency weekly, Strain Yes, and Splinting no Fully empty rectum: No Leakage: No Fiber supplement: stool softener and dulcolax, senna - sees GI in [place]    URINATION: Pain with urination: No Fully empty bladder: Yes:   Stream: Strong Urgency: No Frequency: average Leakage:  none     INTERCOURSE:no active   PREGNANCY: no pregnancies       PROLAPSE: None     OBJECTIVE:    DIAGNOSTIC FINDINGS:  Pelvic floor strength  II/V  PVR of 144 ml was obtained by bladder scan  No evidence of urethral prolapse Hymenal ring is slightly more prominent      COGNITION: Overall cognitive status: Within functional limits for tasks assessed                          SENSATION: Light touch: Appears intact Proprioception: Appears intact     POSTURE:  scoliosis in thoracolumbar area   PELVIC ALIGNMENT:   LUMBARAROM/PROM: full lumbar ROM     LOWER EXTREMITY ROM: Full bilateral hip ROM     LOWER EXTREMITY MMT:   MMT Right eval Left eval Right/left  08/02/22  Hip abduction 4/5 4/5 4/5    PALPATION:   General  decreased mobility of the lower rib cage, Tightness of diaphragm, tenderness located on the right lower quadrant, lower abdominal tissue has fascial restrictions.                  External Perineal Exam tenderness along the coccygeus, external anal sphincter                             Internal Pelvic Floor tenderness located on the right obturator internist, right iliococcygeus, external and internal anal sphincter; her pelvic floor muscles relax when she brings her knees to chest. Not able to push therapist finger out of the anal canal.    Patient confirms identification and approves PT to assess internal pelvic floor and treatment Yes   PELVIC MMT:   MMT eval  Internal Anal Sphincter 2/5  External Anal Sphincter 2/5  Puborectalis 2/5  (Blank rows = not tested)         TONE: increased     TODAY'S TREATMENT:  09/09/22 Manual: Soft tissue mobilization:  Intercostals of the lower rib cage Manual work to the abdominals to improve the tissue mobility .  Myofascial release: Using the suction cup to release the  tissue around the anterior and lateral lower rib cage and abdominal tissue Exercises: Stretches/mobility: Lean on ball to stretch the lateral trunk with breath into the rib cage to elongate the tissue bilateral sides Supine on ball with breath to expand the lower rib cage to improve  mobility Roll ball forward with breathing into the lower posterior rib cage Roll ball upward on the wall to stretch the anterior trunk  08/02/22 Manual: Myofascial release: Using the suction cup to the lower abdominal and around the rib cage to open up the ribs and reduce the fascial tightness.  Neuromuscular re-education: Pelvic floor contraction training: Using the RUSI to train the pelvic floor to relax with diaphragmatic breathing and during breathing out to push stool out utilizing maximal verbal and visual cueing to train the activity. Patient was in supine using the transperineal setting.  Tried the breath with ahhh and then like she is blowing out candles to expand the rectum with blowing out candles working best Tactile cues to the lateral lower rib cage to expand with diaphragmatic breathing to perform pelvic drop  07/03/22 Manual: Soft tissue mobilization: Manual work to the diaphragm in supine and sidely working with breath Circular massage to the abdomen to improve peristalic motion of the intestines Myofascial release: Using the suction cup to the abdomen and rib cage to release the tissue Tissue rolling along the abdomen and sides of the lower rib cage Release along the umbilicus with leg movement Release of the lower abdominal area to improve the tissue mobilty Exercises: Stretches/mobility: Laying on ball under the ribs to open the top rib cage Open book to increase thoracic rotation Diaphragmatic breathing in supine    PATIENT EDUCATION:  07/03/22 Education details: Access Code: [email or web address]; educated patient on how to sit on a ball and massage the pelvic floor muscles, diaphragmatic breathing while sitting on the ball, toileting, abdominal massage Person educated: Patient Education method: Explanation, Demonstration, Tactile cues, Verbal cues, Handouts, and you tube video Education comprehension: verbalized understanding, returned demonstration, verbal cues required,  tactile cues required, and needs further education   HOME EXERCISE PROGRAM: 07/03/22 Access Code: [email or web address] URL: [email or web address] Date: 07/03/2022 Prepared by: Eulis Foster   Program Notes sit on ball and massage pelvic floor   Exercises - Seated Diaphragmatic Breathing  - 2 x daily - 7 x weekly - 1 sets - 10 reps - Sidelying Thoracic Rotation with Open Book  - 1 x daily - 7 x weekly - 3 sets - 10 reps - Thoracic Sidebending with Towel Roll  - 1 x daily - 7 x weekly - 1 sets - 10 reps     ASSESSMENT:   CLINICAL IMPRESSION: Patient is a 22 y.o. female who was seen today for physical therapy  treatment for constipation. Patient had no pain after treatment. Patient has fascial restrictions on the lower abdomen and lower rib cage. She has improve lower rib mobility. Patient feels she has increased control of her pain lately. Patient will benefit from skilled therapy to improve coordination and relaxation of her muscles to reduce pain and constipation.    OBJECTIVE IMPAIRMENTS: decreased activity tolerance, decreased endurance, decreased strength, increased fascial restrictions, increased muscle spasms, and pain.    ACTIVITY LIMITATIONS: toileting   PARTICIPATION LIMITATIONS: community activity   PERSONAL FACTORS: Age, Time since onset of injury/illness/exacerbation, and 3+ comorbidities: Dysautonomia; IBS; POTS; Scoliosis of Thoracolumbar spine; OCD  are also affecting patient's functional outcome.    REHAB POTENTIAL: Excellent   CLINICAL DECISION MAKING: Evolving/moderate  complexity   EVALUATION COMPLEXITY: Moderate     GOALS: Goals reviewed with patient? Yes   SHORT TERM GOALS: Target date: 07/11/22   Patient is able to demonstrate correct toileting technique to have a bowel movement.  Baseline: still working on the breath work Goal status: ongoing 08/02/22   2.  Patient understands how to perform diaphragmatic breathing with expansion of her rib cage to  relax her pelvic floor.  Baseline:  Goal status: Met 08/02/22   3.  Patient understands how to perform abdominal massage to assist with peristalic motion of the intestines.  Baseline:  Goal status: Met 08/02/22   4.  Patient reports her pain reduced >/= 25% due to reduction of trigger points and increased in muscle relaxation.  Baseline: decreased by 20% Goal status: ongoing 08/02/22     LONG TERM GOALS: Target date: 09/06/22   Patient is independent with advanced HEP to improve pelvic floor coordination for a bowel movement.  Baseline:  Goal status: INITIAL   2.  Patient is able to contract the lower abdominals equally with the upper abdominals so she is able to generate correct force to push stool out.  Baseline:  Goal status: INITIAL   3.  Patient pelvic floor muscles are relaxed  and reduced trigger points so her pain decreased >/= 75%.  Baseline:  Goal status: INITIAL   4.  Patient is able to have a bowel movement 2 times per week due to improve intestinal mobility and relaxation of the pelvic floor.  Baseline:  Goal status: INITIAL   5.  Patient is able to push the therapist finger out of the rectum due to improve coordination of her pelvic floor.  Baseline:  Goal status: INITIAL       PLAN:   PT FREQUENCY: 1x/week   PT DURATION: 12 weeks   PLANNED INTERVENTIONS: Therapeutic exercises, Therapeutic activity, Neuromuscular re-education, Patient/Family education, Joint mobilization, Dry Needling, Electrical stimulation, Spinal mobilization, Cryotherapy, Moist heat, Taping, Ultrasound, Biofeedback, and Manual therapy   PLAN FOR NEXT SESSION: manual work on the outside of the anus and along the abdomen,   lower abdominal contraction, work on [employer], PT 09/09/22 9:26 AM

## 2022-09-11 ENCOUNTER — Other Ambulatory Visit: Payer: Self-pay

## 2022-09-12 ENCOUNTER — Encounter: Payer: Self-pay | Admitting: Family Medicine

## 2022-09-12 ENCOUNTER — Encounter: Payer: Self-pay | Admitting: Nurse Practitioner

## 2022-09-13 ENCOUNTER — Encounter: Payer: 59 | Admitting: Physical Therapy

## 2022-09-18 ENCOUNTER — Encounter: Payer: Self-pay | Admitting: Obstetrics and Gynecology

## 2022-09-18 ENCOUNTER — Ambulatory Visit (INDEPENDENT_AMBULATORY_CARE_PROVIDER_SITE_OTHER): Payer: 59 | Admitting: Obstetrics and Gynecology

## 2022-09-18 VITALS — BP 101/69 | HR 84 | Wt 155.8 lb

## 2022-09-18 DIAGNOSIS — N946 Dysmenorrhea, unspecified: Secondary | ICD-10-CM | POA: Diagnosis not present

## 2022-09-18 NOTE — Progress Notes (Signed)
GYNECOLOGY VISIT  Patient name: April Williams MRN [id number]  Date of birth: Mar 10, 2001 Chief Complaint:   Menstrual Problem  History:  April Williams is a 22 y.o. G0P0000 being seen today for pelvic pain and irregular bleeding.  Prior to this pill had been having more regular periods after initial 3 month adjustment to the pill followed by regular 5 days of menses. Most recent breakthrough was about 5 days in length. Prior to POPs had very painful menses. Continues to have some pain with bleeding for which she takes naproxen. Feels the naproxen works well.   Uncle and grandfather had blood clots  Would be open CHC if deemed safe by heme or IUD (though worried about insertion pain) Will have a constant dull headhache, no aura   Not currently sexually, no previously active Non-penetrative mastrubation - will have pain, will have achiness afterwards   Does not want surgery just as of yet  Patches eczema have increased recently   Without any medication on board, menses were a lot longer and heavier with pain.   Constipation: GI in [place], currently on senna and dulcolax; lifelong issue of constipation, can vary how much time may go b/n BM   Urination: no issues, able to competely empty, no icontoneince, no blood in iurine, no pain with a full bladder   Had 2 periods last month - normally when she ovulates she has pain on one side and this time had pain on bilateral sides which is new. Has been o POP for overy 6 months - told she can't do estrogen due to FH of stroke. Has been having more spotting - not currently sexually active. Not heavy bleeding or clots.   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   Health Maintenance:   Last pap n/a Last mammogram: n/a   Review of Systems:  Pertinent items are noted in HPI. Comprehensive review of systems was otherwise negative.   Objective:  Physical Exam BP 101/69   Pulse 84   Wt 155 lb 12.8 oz (70.7 kg)   LMP 08/02/2022 (Approximate)   BMI 24.34 kg/m    Physical Exam Vitals and nursing note reviewed.  Constitutional:      Appearance: Normal appearance.  HENT:     Head: Normocephalic and atraumatic.  Pulmonary:     Effort: Pulmonary effort is normal.  Abdominal:     Comments: - carnett Nontender  No allodynia or hyperalgesia  Skin:    General: Skin is warm and dry.  Neurological:     General: No focal  deficit present.     Mental Status: She is alert.  Psychiatric:        Mood and Affect: Mood normal.        Behavior: Behavior normal.        Thought Content: Thought content normal.        Judgment: Judgment normal.       Assessment & Plan:   1. Dysmenorrhea History concerning for endometriosis given severity of dysmenorrhea and duration as well as improvement with POP. FH of VTE on maternal side and so have avoided CHCs for this reason. Has appointment with hematology - if no genetic predisposition and ok by heme, can consider CHC for better menstrual suppression. Reviewed that imaging modality may not demonstrate endometriosis and technically diagnosis by surgery -  does not want surgery at this point and ok with continuing with menstrual suppression and analgesia. Noted likely has concomitant pelvic dysfunction from dysmenorrhea and constipation. Pelvic US ordered to assess GYN structures and follow up therafter.  - US PELVIC COMPLETE WITH TRANSVAGINAL; Future  Would be open to having an IUD if the POP continues to have BTB.    Routine preventative health maintenance measures emphasized.  Lorriane Shire, MD Minimally Invasive Gynecologic Surgery [facility], [facility]

## 2022-09-20 ENCOUNTER — Encounter: Payer: Self-pay | Admitting: Physical Therapy

## 2022-09-20 ENCOUNTER — Ambulatory Visit: Payer: 59 | Admitting: Physical Therapy

## 2022-09-20 DIAGNOSIS — R1031 Right lower quadrant pain: Secondary | ICD-10-CM

## 2022-09-20 DIAGNOSIS — M6281 Muscle weakness (generalized): Secondary | ICD-10-CM | POA: Diagnosis not present

## 2022-09-20 DIAGNOSIS — R279 Unspecified lack of coordination: Secondary | ICD-10-CM

## 2022-09-20 NOTE — Therapy (Signed)
OUTPATIENT PHYSICAL THERAPY TREATMENT NOTE   Patient Name: April Williams MRN: [id number] DOB:2001/02/25, 22 y.o., female Today's Date: 09/20/2022  PCP: Tollie Eth, NP  REFERRING PROVIDER:  Federico Flake, MD   END OF SESSION:   PT End of Session - 09/20/22 0844     Visit Number 5    Date for PT Re-Evaluation 11/29/22    Authorization Type [facility]    PT Start Time 0845    PT Stop Time 0925    PT Time Calculation (min) 40 min    Activity Tolerance Patient tolerated treatment well    Behavior During Therapy [facility] for tasks assessed/performed             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    Irritable bowel syndrome    Nasal polyps    Obsessive-compulsive disorder    Physical deconditioning 04/21/2018   Plantar wart    POTS (postural orthostatic tachycardia syndrome)    Scoliosis of thoracolumbar spine    Vitamin D deficiency    Past Surgical History:  Procedure Laterality Date   NASAL SINUS SURGERY  06/12/2017   wisdom tooth removal     Patient Active Problem List   Diagnosis Date Noted   Encounter for medical examination to establish care 03/21/2022   Ectopic atrial rhythm 06/13/2020   Irritable bowel syndrome    Obsessive compulsive disorder 06/15/2019   Persistent depressive disorder with melancholic features, currently moderate 06/15/2019   Avoidant-restrictive food intake disorder (ARFID) 06/15/2019   Adjustment disorder with mixed anxiety and depressed mood 07/28/2018   Lump of right breast 06/30/2018   Dysautonomia (HCC) 09/02/2017   Adolescent idiopathic scoliosis of thoracolumbar region 08/04/2017   Delayed sleep phase syndrome 08/01/2017   POTS (postural orthostatic tachycardia  syndrome) 06/30/2017   Chronic daily headache 06/30/2017   Chronic pansinusitis 05/27/2017   REFERRING DIAG: K59.04 (ICD-10-CM) - Chronic idiopathic constipation    THERAPY DIAG:  Muscle weakness (generalized)   Unspecified lack of coordination   Right lower quadrant abdominal pain   Rationale for Evaluation and Treatment: Rehabilitation   ONSET DATE: 2021   SUBJECTIVE:  SUBJECTIVE STATEMENT: I saw the doctor and she thinks I may have endometriosis. I will be going for a transvaginal ultrasound. Patient is straining 40% less with bowel movements.      PAIN:  Are you having pain? Yes NPRS scale: 6/10, if rotate her waist to the right 8/10 Pain location:  right lower abdominal   Pain type: aching and dull Pain description: intermittent    Aggravating factors: certain foods, eating, sitting on commode with feet on squatty potty increases rectal pain Relieving factors: occasional enemas     PRECAUTIONS: None   WEIGHT BEARING RESTRICTIONS: No   FALLS:  Has patient fallen in last 6 months? No   LIVING ENVIRONMENT: Lives with: lives with their family     OCCUPATION: student   PLOF: Independent   PATIENT GOALS: reduce pain    PERTINENT HISTORY:  Dysautonomia; IBS; POTS; Scoliosis of Thoracolumbar spine; OCD   BOWEL MOVEMENT: Pain with bowel movement: Yes Type of bowel movement:Type (Bristol Stool Scale) Type 3, 6, 7, Frequency weekly, Strain Yes, and Splinting no Fully empty rectum: No Leakage: No Fiber supplement: stool softener and dulcolax, senna - sees GI in [place]    URINATION: Pain with urination: No Fully empty bladder: Yes:   Stream: Strong Urgency: No Frequency: average Leakage:  none     INTERCOURSE:no active   PREGNANCY: no pregnancies        PROLAPSE: None     OBJECTIVE:    DIAGNOSTIC FINDINGS:  Pelvic floor strength II/V  PVR of 144 ml was obtained by bladder scan  No evidence of urethral prolapse Hymenal ring is slightly more prominent      COGNITION: Overall cognitive status: Within functional limits for tasks assessed                          SENSATION: Light touch: Appears intact Proprioception: Appears intact     POSTURE:  scoliosis in thoracolumbar area   PELVIC ALIGNMENT:   LUMBARAROM/PROM: full lumbar ROM     LOWER EXTREMITY ROM: Full bilateral hip ROM     LOWER EXTREMITY MMT:   MMT Right eval Left eval Right/left  08/02/22  Hip abduction 4/5 4/5 4/5    PALPATION:   General  decreased mobility of the lower rib cage, Tightness of diaphragm, tenderness located on the right lower quadrant, lower abdominal tissue has fascial restrictions.                  External Perineal Exam tenderness along the coccygeus, external anal sphincter                             Internal Pelvic Floor tenderness located on the right obturator internist, right iliococcygeus, external and internal anal sphincter; her pelvic floor muscles relax when she brings her knees to chest. Not able to push therapist finger out of the anal canal.    Patient confirms identification and approves PT to assess internal pelvic floor and treatment Yes   PELVIC MMT:   MMT eval  Internal Anal Sphincter 2/5  External Anal Sphincter 2/5  Puborectalis 2/5  (Blank rows = not tested)         TONE: increased     TODAY'S TREATMENT:  09/20/22 Electrotherapy: Electrical stimulation to the lower abdomen and S1 area for 20 minutes with heat to the lower abdomen for pain relief Manual: Soft tissue mobilization: Manual work to the  gluteals, coccygeus, levator ani, SI joint, and around the anal sphincter muscles to lengthen and reduce pain. Patient would do hip movements to elongate the muscles with manual work.  Spinal  mobilization: PA mobilization to L3-L5 grade 3  09/09/22 Manual: Soft tissue mobilization:  Intercostals of the lower rib cage Manual work to the abdominals to improve the tissue mobility .  Myofascial release: Using the suction cup to release the tissue around the anterior and lateral lower rib cage and abdominal tissue Exercises: Stretches/mobility: Lean on ball to stretch the lateral trunk with breath into the rib cage to elongate the tissue bilateral sides Supine on ball with breath to expand the lower rib cage to improve mobility Roll ball forward with breathing into the lower posterior rib cage Roll ball upward on the wall to stretch the anterior trunk  08/02/22 Manual: Myofascial release: Using the suction cup to the lower abdominal and around the rib cage to open up the ribs and reduce the fascial tightness.  Neuromuscular re-education: Pelvic floor contraction training: Using the RUSI to train the pelvic floor to relax with diaphragmatic breathing and during breathing out to push stool out utilizing maximal verbal and visual cueing to train the activity. Patient was in supine using the transperineal setting.  Tried the breath with ahhh and then like she is blowing out candles to expand the rectum with blowing out candles working best Tactile cues to the lateral lower rib cage to expand with diaphragmatic breathing to perform pelvic drop     PATIENT EDUCATION:  07/03/22 Education details: Access Code: [email or web address]; educated patient on how to sit on a ball and massage the pelvic floor muscles, diaphragmatic breathing while sitting on the ball, toileting, abdominal massage Person educated: Patient Education method: Explanation, Demonstration, Tactile cues, Verbal cues, Handouts, and you tube video Education comprehension: verbalized understanding, returned demonstration, verbal cues required, tactile cues required, and needs further education   HOME EXERCISE  PROGRAM: 07/03/22 Access Code: [email or web address] URL: [email or web address] Date: 07/03/2022 Prepared by: Eulis Foster   Program Notes sit on ball and massage pelvic floor   Exercises - Seated Diaphragmatic Breathing  - 2 x daily - 7 x weekly - 1 sets - 10 reps - Sidelying Thoracic Rotation with Open Book  - 1 x daily - 7 x weekly - 3 sets - 10 reps - Thoracic Sidebending with Towel Roll  - 1 x daily - 7 x weekly - 1 sets - 10 reps     ASSESSMENT:   CLINICAL IMPRESSION: Patient is a 22 y.o. female who was seen today for physical therapy  treatment for constipation. Patient tried the electrical stimulation to see if it would assist with abdominal pain. She saw the MD and may have endometriosis so working on pain management. Patient reports she strains with bowel movements 40% less.  Patient will benefit from skilled therapy to improve coordination and relaxation of her muscles to reduce pain and constipation.    OBJECTIVE IMPAIRMENTS: decreased activity tolerance, decreased endurance, decreased strength, increased fascial restrictions, increased muscle spasms, and pain.    ACTIVITY LIMITATIONS: toileting   PARTICIPATION LIMITATIONS: community activity   PERSONAL FACTORS: Age, Time since onset of injury/illness/exacerbation, and 3+ comorbidities: Dysautonomia; IBS; POTS; Scoliosis of Thoracolumbar spine; OCD  are also affecting patient's functional outcome.    REHAB POTENTIAL: Excellent   CLINICAL DECISION MAKING: Evolving/moderate complexity   EVALUATION COMPLEXITY: Moderate     GOALS: Goals reviewed with patient? Yes   SHORT TERM GOALS: Target date: 07/11/22  Patient is able to demonstrate correct toileting technique to have a bowel movement.  Baseline: still working on the breath work Goal status: ongoing 08/02/22   2.  Patient understands how to perform diaphragmatic breathing with expansion of her rib cage to relax her pelvic floor.  Baseline:  Goal status: Met  08/02/22   3.  Patient understands how to perform abdominal massage to assist with peristalic motion of the intestines.  Baseline:  Goal status: Met 08/02/22   4.  Patient reports her pain reduced >/= 25% due to reduction of trigger points and increased in muscle relaxation.  Baseline: decreased by 20% Goal status: ongoing 08/02/22     LONG TERM GOALS: Target date: 09/06/22   Patient is independent with advanced HEP to improve pelvic floor coordination for a bowel movement.  Baseline:  Goal status: INITIAL   2.  Patient is able to contract the lower abdominals equally with the upper abdominals so she is able to generate correct force to push stool out.  Baseline:  Goal status: INITIAL   3.  Patient pelvic floor muscles are relaxed  and reduced trigger points so her pain decreased >/= 75%.  Baseline:  Goal status: INITIAL   4.  Patient is able to have a bowel movement 2 times per week due to improve intestinal mobility and relaxation of the pelvic floor.  Baseline:  Goal status: INITIAL   5.  Patient is able to push the therapist finger out of the rectum due to improve coordination of her pelvic floor.  Baseline:  Goal status: INITIAL       PLAN:   PT FREQUENCY: 1x/week   PT DURATION: 12 weeks   PLANNED INTERVENTIONS: Therapeutic exercises, Therapeutic activity, Neuromuscular re-education, Patient/Family education, Joint mobilization, Dry Needling, Electrical stimulation, Spinal mobilization, Cryotherapy, Moist heat, Taping, Ultrasound, Biofeedback, and Manual therapy   PLAN FOR NEXT SESSION: manual work on the outside of the anus and along the abdomen,   lower abdominal contraction, work on [employer], PT 09/20/22 8:45 AM

## 2022-09-23 ENCOUNTER — Ambulatory Visit: Payer: 59 | Attending: Family Medicine | Admitting: Physical Therapy

## 2022-09-23 ENCOUNTER — Encounter: Payer: Self-pay | Admitting: Physical Therapy

## 2022-09-23 DIAGNOSIS — M6281 Muscle weakness (generalized): Secondary | ICD-10-CM | POA: Diagnosis not present

## 2022-09-23 DIAGNOSIS — R279 Unspecified lack of coordination: Secondary | ICD-10-CM | POA: Diagnosis not present

## 2022-09-23 DIAGNOSIS — R1031 Right lower quadrant pain: Secondary | ICD-10-CM | POA: Diagnosis not present

## 2022-09-23 NOTE — Therapy (Signed)
OUTPATIENT PHYSICAL THERAPY TREATMENT NOTE   Patient Name: April Williams MRN: [id number] DOB:2001-02-28, 22 y.o., female Today's Date: 09/23/2022  PCP: Tollie Eth, NP  REFERRING PROVIDER:  Federico Flake, MD   END OF SESSION:   PT End of Session - 09/23/22 1231     Visit Number 6    Date for PT Re-Evaluation 11/29/22    Authorization Type [facility]    PT Start Time 1230    PT Stop Time 1310    PT Time Calculation (min) 40 min    Activity Tolerance Patient tolerated treatment well    Behavior During Therapy [facility] for tasks assessed/performed             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    Irritable bowel syndrome    Nasal polyps    Obsessive-compulsive disorder    Physical deconditioning 04/21/2018   Plantar wart    POTS (postural orthostatic tachycardia syndrome)    Scoliosis of thoracolumbar spine    Vitamin D deficiency    Past Surgical History:  Procedure Laterality Date   NASAL SINUS SURGERY  06/12/2017   wisdom tooth removal     Patient Active Problem List   Diagnosis Date Noted   Encounter for medical examination to establish care 03/21/2022   Ectopic atrial rhythm 06/13/2020   Irritable bowel syndrome    Obsessive compulsive disorder 06/15/2019   Persistent depressive disorder with melancholic features, currently moderate 06/15/2019   Avoidant-restrictive food intake disorder (ARFID) 06/15/2019   Adjustment disorder with mixed anxiety and depressed mood 07/28/2018   Lump of right breast 06/30/2018   Dysautonomia (HCC) 09/02/2017   Adolescent idiopathic scoliosis of thoracolumbar region 08/04/2017   Delayed sleep phase syndrome 08/01/2017   POTS (postural orthostatic tachycardia  syndrome) 06/30/2017   Chronic daily headache 06/30/2017   Chronic pansinusitis 05/27/2017   REFERRING DIAG: K59.04 (ICD-10-CM) - Chronic idiopathic constipation    THERAPY DIAG:  Muscle weakness (generalized)   Unspecified lack of coordination   Right lower quadrant abdominal pain   Rationale for Evaluation and Treatment: Rehabilitation   ONSET DATE: 2021   SUBJECTIVE:  SUBJECTIVE STATEMENT: I felt good after last visit. I did not feel any lasting pain from the stimulation.     PAIN:  Are you having pain? Yes NPRS scale: 4/10, if rotate her waist to the right 8/10 Pain location:  right lower abdominal   Pain type: aching and dull Pain description: intermittent    Aggravating factors: certain foods, eating, sitting on commode with feet on squatty potty increases rectal pain Relieving factors: occasional enemas     PRECAUTIONS: None   WEIGHT BEARING RESTRICTIONS: No   FALLS:  Has patient fallen in last 6 months? No   LIVING ENVIRONMENT: Lives with: lives with their family     OCCUPATION: student   PLOF: Independent   PATIENT GOALS: reduce pain    PERTINENT HISTORY:  Dysautonomia; IBS; POTS; Scoliosis of Thoracolumbar spine; OCD   BOWEL MOVEMENT: Pain with bowel movement: Yes Type of bowel movement:Type (Bristol Stool Scale) Type 3, 6, 7, Frequency weekly, Strain Yes, and Splinting no Fully empty rectum: No Leakage: No Fiber supplement: stool softener and dulcolax, senna - sees GI in [place]    URINATION: Pain with urination: No Fully empty bladder: Yes:   Stream: Strong Urgency: No Frequency: average Leakage:  none     INTERCOURSE:no active   PREGNANCY: no pregnancies       PROLAPSE: None     OBJECTIVE:    DIAGNOSTIC FINDINGS:  Pelvic floor strength  II/V  PVR of 144 ml was obtained by bladder scan  No evidence of urethral prolapse Hymenal ring is slightly more prominent      COGNITION: Overall cognitive status: Within functional limits for tasks assessed                          SENSATION: Light touch: Appears intact Proprioception: Appears intact     POSTURE:  scoliosis in thoracolumbar area   PELVIC ALIGNMENT:   LUMBARAROM/PROM: full lumbar ROM     LOWER EXTREMITY ROM: Full bilateral hip ROM     LOWER EXTREMITY MMT:   MMT Right eval Left eval Right/left  08/02/22  Hip abduction 4/5 4/5 4/5    PALPATION:   General  decreased mobility of the lower rib cage, Tightness of diaphragm, tenderness located on the right lower quadrant, lower abdominal tissue has fascial restrictions.                  External Perineal Exam tenderness along the coccygeus, external anal sphincter                             Internal Pelvic Floor tenderness located on the right obturator internist, right iliococcygeus, external and internal anal sphincter; her pelvic floor muscles relax when she brings her knees to chest. Not able to push therapist finger out of the anal canal.    Patient confirms identification and approves PT to assess internal pelvic floor and treatment Yes   PELVIC MMT:   MMT eval  Internal Anal Sphincter 2/5  External Anal Sphincter 2/5  Puborectalis 2/5  (Blank rows = not tested)         TONE: increased     TODAY'S TREATMENT:  09/23/22 Manual: Soft tissue mobilization: Circular massage to the abdomen to promote peristalic motion of the intestines Manual work to the diaphragm to improve mobility Myofascial release: Fascial release of the lower abdomen, mesenteric root, along the suprapubic area, along the lower abdomen  Exercises: Strengthening: Supine hip flexion isometrics holding 2 sec 10 x each side Bridge with ball squeeze 10 x and verbal cues to go slowly Sidely with transverse abdominus 5 times each  side Quadruped abdominal contraction with weight shifting forward and backward 09/20/22 Electrotherapy: Electrical stimulation to the lower abdomen and S1 area for 20 minutes with heat to the lower abdomen for pain relief Manual: Soft tissue mobilization: Manual work to the gluteals, coccygeus, levator ani, SI joint, and around the anal sphincter muscles to lengthen and reduce pain. Patient would do hip movements to elongate the muscles with manual work.  Spinal mobilization: PA mobilization to L3-L5 grade 3  09/09/22 Manual: Soft tissue mobilization:  Intercostals of the lower rib cage Manual work to the abdominals to improve the tissue mobility .  Myofascial release: Using the suction cup to release the tissue around the anterior and lateral lower rib cage and abdominal tissue Exercises: Stretches/mobility: Lean on ball to stretch the lateral trunk with breath into the rib cage to elongate the tissue bilateral sides Supine on ball with breath to expand the lower rib cage to improve mobility Roll ball forward with breathing into the lower posterior rib cage Roll ball upward on the wall to stretch the anterior trunk     PATIENT EDUCATION:  09/23/22 Education details: Access Code: [email or web address]; educated patient on how to sit on a ball and massage the pelvic floor muscles, diaphragmatic breathing while sitting on the ball, toileting, abdominal massage Person educated: Patient Education method: Explanation, Demonstration, Tactile cues, Verbal cues, Handouts, and you tube video Education comprehension: verbalized understanding, returned demonstration, verbal cues required, tactile cues required, and needs further education   HOME EXERCISE PROGRAM: 09/23/22 Access Code: [email or web address] URL: [email or web address] Date: 09/23/2022 Prepared by: Eulis Foster  Program Notes sit on ball and massage pelvic floor  Exercises - - Hooklying Isometric Hip Flexion  - 1 x daily - 3 x weekly - 1  sets - 10 reps - 2 sec hold - Supine Bridge with Mini Swiss Ball Between Knees  - 1 x daily - 3 x weekly - 1 sets - 10 reps - Sidelying Transversus Abdominis Bracing  - 1 x daily - 3 x weekly - 1 sets - 5 reps - Quadruped Forward Weight Shift  - 1 x daily - 3 x weekly - 1 sets - 10 reps   ASSESSMENT:   CLINICAL IMPRESSION: Patient is a 22 y.o. female who was seen today for physical therapy  treatment for constipation. Patient is able to rotate her trunk without lower quadrant pain now.   Patient is straining 40% less with bowel movements.  Patient has tightness in the abdomen and diaphragm. Her ribs do not flare at the end as much now. She was able to contract her lower abdomen. Patient has 1 bowel movement per week sometimes. Patient will benefit from skilled therapy to improve coordination and relaxation of her muscles to reduce pain and constipation.    OBJECTIVE IMPAIRMENTS: decreased activity tolerance, decreased endurance, decreased strength, increased fascial restrictions, increased muscle spasms, and pain.    ACTIVITY LIMITATIONS: toileting   PARTICIPATION LIMITATIONS: community activity   PERSONAL FACTORS: Age, Time since onset of injury/illness/exacerbation, and 3+ comorbidities: Dysautonomia; IBS; POTS; Scoliosis of Thoracolumbar spine; OCD  are also affecting patient's functional outcome.    REHAB POTENTIAL: Excellent   CLINICAL DECISION MAKING: Evolving/moderate complexity   EVALUATION COMPLEXITY: Moderate     GOALS: Goals reviewed with patient? Yes   SHORT TERM GOALS: Target  date: 07/11/22   Patient is able to demonstrate correct toileting technique to have a bowel movement.  Baseline: still working on the breath work Goal status: ongoing 08/02/22   2.  Patient understands how to perform diaphragmatic breathing with expansion of her rib cage to relax her pelvic floor.  Baseline:  Goal status: Met 08/02/22   3.  Patient understands how to perform abdominal massage to  assist with peristalic motion of the intestines.  Baseline:  Goal status: Met 08/02/22   4.  Patient reports her pain reduced >/= 25% due to reduction of trigger points and increased in muscle relaxation.  Baseline: decreased by 20% Goal status: Met 09/23/22    LONG TERM GOALS: Target date: 09/06/22   Patient is independent with advanced HEP to improve pelvic floor coordination for a bowel movement.  Baseline:  Goal status: INITIAL   2.  Patient is able to contract the lower abdominals equally with the upper abdominals so she is able to generate correct force to push stool out.  Baseline:  Goal status: INITIAL   3.  Patient pelvic floor muscles are relaxed  and reduced trigger points so her pain decreased >/= 75%.  Baseline:  Goal status: INITIAL   4.  Patient is able to have a bowel movement 2 times per week due to improve intestinal mobility and relaxation of the pelvic floor.  Baseline:  Goal status: INITIAL   5.  Patient is able to push the therapist finger out of the rectum due to improve coordination of her pelvic floor.  Baseline:  Goal status: INITIAL       PLAN:   PT FREQUENCY: 1x/week   PT DURATION: 12 weeks   PLANNED INTERVENTIONS: Therapeutic exercises, Therapeutic activity, Neuromuscular re-education, Patient/Family education, Joint mobilization, Dry Needling, Electrical stimulation, Spinal mobilization, Cryotherapy, Moist heat, Taping, Ultrasound, Biofeedback, and Manual therapy   PLAN FOR NEXT SESSION: manual work on the outside of the anus and along the abdomen,  work on pushing the therapist finger out of the rectum   Eulis Foster, PT 09/23/22 1:12 PM

## 2022-09-25 ENCOUNTER — Other Ambulatory Visit: Payer: Self-pay | Admitting: Obstetrics and Gynecology

## 2022-09-25 ENCOUNTER — Ambulatory Visit (HOSPITAL_COMMUNITY)
Admission: RE | Admit: 2022-09-25 | Discharge: 2022-09-25 | Disposition: A | Discharge status: Home / Self Care | Payer: 59 | Source: Ambulatory Visit | Attending: Obstetrics and Gynecology | Admitting: Obstetrics and Gynecology

## 2022-09-25 DIAGNOSIS — N946 Dysmenorrhea, unspecified: Secondary | ICD-10-CM | POA: Insufficient documentation

## 2022-09-25 DIAGNOSIS — N939 Abnormal uterine and vaginal bleeding, unspecified: Secondary | ICD-10-CM | POA: Diagnosis not present

## 2022-09-27 ENCOUNTER — Encounter: Payer: 59 | Admitting: Physical Therapy

## 2022-10-02 ENCOUNTER — Telehealth: Payer: Self-pay | Admitting: *Deleted

## 2022-10-02 NOTE — Telephone Encounter (Addendum)
-----   Message from Jerene Bears, MD sent at 10/02/2022  6:32 AM EDT ----- Please let pt know her ultrasound is normal.  Has follow up with Dr. Briscoe Deutscher scheduled 10/03/2022 and she should keep that appt.  Lum Keas, MD, covering for Dr. Briscoe Deutscher  7/10  1135  Called pt and she did not answer. Message left on her personal voicemail stating US result is normal and to please keep follow up appointment on 7/11 as scheduled. She may call back if she has questions.

## 2022-10-03 ENCOUNTER — Encounter: Payer: Self-pay | Admitting: Obstetrics and Gynecology

## 2022-10-03 ENCOUNTER — Ambulatory Visit (INDEPENDENT_AMBULATORY_CARE_PROVIDER_SITE_OTHER): Payer: 59 | Admitting: Obstetrics and Gynecology

## 2022-10-03 VITALS — BP 98/65 | HR 67 | Wt 157.1 lb

## 2022-10-03 DIAGNOSIS — Z823 Family history of stroke: Secondary | ICD-10-CM | POA: Diagnosis not present

## 2022-10-03 DIAGNOSIS — N926 Irregular menstruation, unspecified: Secondary | ICD-10-CM

## 2022-10-03 DIAGNOSIS — N946 Dysmenorrhea, unspecified: Secondary | ICD-10-CM

## 2022-10-03 NOTE — Progress Notes (Signed)
GYNECOLOGY VISIT  Patient name: April Williams MRN [id number]  Date of birth: September 25, 2000 Chief Complaint:   mentrual problems follow up   History:  April Williams is a 22 y.o. G0P0000 being seen today for [place] follow up regarding breakthrough bleeding.  BTB has ceased but continues to have pelvic pian at this time time. Pain currently 4/10. Has started PFPT and feels she is having improvement. Not able to see heme/onc since it was peds and needs referral.   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   Review of Systems:  Pertinent items are noted in HPI. Comprehensive review of systems was otherwise negative.   Objective:  Physical Exam BP 98/65   Pulse 67   Wt 157 lb 1.6 oz (71.3 kg)   LMP 09/10/2022 (Approximate)   BMI 24.54 kg/m    Physical Exam Vitals and nursing note reviewed.  Constitutional:      Appearance: Normal appearance.  HENT:     Head: Normocephalic and atraumatic.  Pulmonary:     Effort: Pulmonary effort is normal.  Skin:    General: Skin is warm and dry.  Neurological:     General: No focal deficit present.      Mental Status: She is alert.  Psychiatric:        Mood and Affect: Mood normal.        Behavior: Behavior normal.        Thought Content: Thought content normal.        Judgment: Judgment normal.      Labs and Imaging US PELVIS (TRANSABDOMINAL ONLY)  Result Date: 09/26/2022 CLINICAL DATA:  dysmenorrhea and abnormal uterine bleeding EXAM: TRANSABDOMINAL ULTRASOUND OF PELVIS TECHNIQUE: Transabdominalultrasound examination of the pelvis was performed including evaluation of the uterus, ovaries, adnexal regions, and pelvic cul-de-sac. COMPARISON:  None Available. FINDINGS: Uterusanteverted, 8 x 5 x 3 cm. The endometrium unremarkable, 5 mm. The uterine cavity is empty. There are no uterine masses. Right ovary Unremarkable, 3.3 x 3.1 x 1.9 cm. Left ovary Unremarkable 3.2 x 2.1 x 1.9 cm. Images of the adnexae demonstrated no masses or fluid collections. IMPRESSION: Unremarkable examination of the pelvis. Electronically Signed   By: Layla Maw M.D.   On: 09/26/2022 20:59       Assessment & Plan:   1. Irregular menstrual cycle 2. Dysmenorrhea No longer having BTB with micronor. [place] without obvious structural causes of pain or bleeding, thin EMS and normal bilateral ovaries. Reiterated that endometriosis can't typically be identified on pelvic US. Previously noted not interested in diagnostic procedure  at this time. Offered alternate POP vs lng-IUD and would prefer to continue with micronor as of now since it seems to be helping. Will hold off on estrogen containing products. Follow up in ~3 months to reassess response to PFPT and if continued stability with POP.   3. Family history of stroke Referral due to FH of VTE/ischemic strokes - Ambulatory referral to Hematology / Oncology   Routine preventative health maintenance measures emphasized.  Lorriane Shire, MD [facility], [facility]

## 2022-10-22 ENCOUNTER — Other Ambulatory Visit (HOSPITAL_COMMUNITY): Payer: Self-pay

## 2022-10-28 ENCOUNTER — Other Ambulatory Visit: Payer: Self-pay

## 2022-10-28 ENCOUNTER — Inpatient Hospital Stay: Payer: 59 | Attending: Hematology and Oncology

## 2022-10-28 ENCOUNTER — Encounter: Payer: Self-pay | Admitting: Hematology and Oncology

## 2022-10-28 ENCOUNTER — Inpatient Hospital Stay (HOSPITAL_BASED_OUTPATIENT_CLINIC_OR_DEPARTMENT_OTHER): Payer: 59 | Admitting: Hematology and Oncology

## 2022-10-28 VITALS — BP 101/59 | HR 67 | Temp 98.4°F | Resp 18 | Ht 67.0 in | Wt 159.8 lb

## 2022-10-28 DIAGNOSIS — Z832 Family history of diseases of the blood and blood-forming organs and certain disorders involving the immune mechanism: Secondary | ICD-10-CM

## 2022-10-28 DIAGNOSIS — E611 Iron deficiency: Secondary | ICD-10-CM

## 2022-10-28 DIAGNOSIS — G90A Postural orthostatic tachycardia syndrome (POTS): Secondary | ICD-10-CM

## 2022-10-28 DIAGNOSIS — D509 Iron deficiency anemia, unspecified: Secondary | ICD-10-CM

## 2022-10-28 LAB — CBC WITH DIFFERENTIAL (CANCER CENTER ONLY)
Abs Immature Granulocytes: 0.02 10*3/uL (ref 0.00–0.07)
Basophils Absolute: 0 10*3/uL (ref 0.0–0.1)
Basophils Relative: 0 %
Eosinophils Absolute: 0.1 10*3/uL (ref 0.0–0.5)
Eosinophils Relative: 1 %
HCT: 45.2 % (ref 36.0–46.0)
Hemoglobin: 15.6 g/dL — ABNORMAL HIGH (ref 12.0–15.0)
Immature Granulocytes: 0 %
Lymphocytes Relative: 18 %
Lymphs Abs: 1.9 10*3/uL (ref 0.7–4.0)
MCH: 28.3 pg (ref 26.0–34.0)
MCHC: 34.5 g/dL (ref 30.0–36.0)
MCV: 81.9 fL (ref 80.0–100.0)
Monocytes Absolute: 0.7 10*3/uL (ref 0.1–1.0)
Monocytes Relative: 6 %
Neutro Abs: 7.8 10*3/uL — ABNORMAL HIGH (ref 1.7–7.7)
Neutrophils Relative %: 75 %
Platelet Count: 456 10*3/uL — ABNORMAL HIGH (ref 150–400)
RBC: 5.52 MIL/uL — ABNORMAL HIGH (ref 3.87–5.11)
RDW: 12 % (ref 11.5–15.5)
WBC Count: 10.6 10*3/uL — ABNORMAL HIGH (ref 4.0–10.5)
nRBC: 0 % (ref 0.0–0.2)

## 2022-10-28 LAB — FERRITIN: Ferritin: 87 ng/mL (ref 11–307)

## 2022-10-28 NOTE — Assessment & Plan Note (Signed)
We have extensive discussions about the role of thrombophilia screening At the end of the day, the patient has decline screening for thrombophilia disorder I warned her about risk of thrombosis even with progesterone only birth control

## 2022-10-28 NOTE — Progress Notes (Signed)
[facility] CONSULT NOTE  Patient Care Team: Early, Sung Amabile, NP as PCP - General (Nurse Practitioner)  ASSESSMENT & PLAN:  Family history of clotting disorder We have extensive discussions about the role of thrombophilia screening At the end of the day, the patient has decline screening for thrombophilia disorder I warned her about risk of thrombosis even with progesterone only birth control  Iron deficiency She have history of borderline iron deficiency but has not been anemic I recommend the patient to take oral iron supplement prior to food intake We will repeat CBC and iron studies If she is not anemic, the patient would not benefit from intravenous iron infusion I will call her with test results Orders Placed This Encounter  Procedures   CBC with Differential (Cancer Center Only)    Standing Status:   Future    Number of Occurrences:   1    Standing Expiration Date:   10/28/2023   Ferritin    Standing Status:   Future    Number of Occurrences:   1    Standing Expiration Date:   10/28/2023    All questions were answered. The patient knows to call the clinic with any problems, questions or concerns.  The total time spent in the appointment was 60 minutes encounter with patients including review of chart and various tests results, discussions about plan of care and coordination of care plan  Artis Delay, MD 8/5/20242:03 PM   CHIEF COMPLAINTS/PURPOSE OF CONSULTATION:  Family history of of thrombotic disorder and iron deficiency   HISTORY OF PRESENTING ILLNESS:  April Williams 22 y.o. female is here because of 2 different reasons.  She is here accompanied by her mother. The patient has strong family history of clotting disorder.  She is also wondering whether she could benefit from intravenous iron infusion  According to the patient's mother, April Williams who is an excellent historian, she identified multiple family members with diagnosis of blood clot. According to  her, her father was diagnosed with blood clot at the age of 19 after presentation with arm pain.  Maternal great grandmother had autopsy done and was confirmed to die from blood clot in her 1s. Her maternal uncle had stroke but have diagnosis of atrial fibrillation There is a question whether the patient should be on birth control pill if she has thrombophilia disorder that has been undiagnosed She is currently taking progesterone only pill for control of menorrhagia.  The patient have heavy menstruation causing iron deficiency.  The patient herself has never been diagnosed with blood clot.  She had multiple surgeries including wisdom teeth removal and sinus surgeries and had never been diagnosed with blood clot. Physically, she is active.  She is on medication due to POTS syndrome She does not smoke.  From the iron deficiency standpoint, she has been placed on oral iron supplement for many years. Initially, she will be taking oral iron supplement daily in the morning with food.  Most recently, due to iron deficiency, she was recommended iron supplement twice daily.  Her second dose is usually taken at nighttime, at least 3 hours after her dinner She has never been found to be anemic.  In fact, she has high hemoglobin consistently  She had not noticed any recent bleeding such as epistaxis, hematuria or hematochezia The patient denies over the counter NSAID ingestion. She is not on antiplatelets agents.  She had no prior history or diagnosis of cancer. Her age appropriate screening programs are up-to-date.  She denies any pica and eats a variety of diet. She never donated blood or received blood transfusion Since she is taking progesterone only birth control, she has intermittent breakthrough bleeding.  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    Irritable bowel syndrome    Nasal polyps    Obsessive-compulsive disorder    Physical deconditioning 04/21/2018   Plantar wart    POTS (postural orthostatic tachycardia syndrome)    Scoliosis of thoracolumbar spine    Vitamin D deficiency     SURGICAL HISTORY: Past Surgical History:  Procedure Laterality Date   NASAL SINUS SURGERY  06/12/2017   wisdom tooth removal      SOCIAL HISTORY: Social History   Socioeconomic History   Marital status: Single    Spouse name: Not on file   Number of children: Not on file   Years of education: Not on file   Highest education level: 9th grade  Occupational History   Occupation: student  Tobacco Use   Smoking status: Never    Passive exposure: Never   Smokeless tobacco: Never  Vaping Use   Vaping status: Never Used  Substance and Sexual Activity   Alcohol use: Never   Drug use: Never   Sexual activity: Never  Other Topics Concern   Not on file  Social History Narrative   April Williams is at [employer] for dual enrollment with an associates in Retail buyer. She would like to be an Transport planner.       She lives with mother and goes to her father's house every other weekend. She has a Cat and a Hedgehog   Has been primarily with mother since Covid 19 restrictions.    She enjoys playing video games, sing, and sew.          06/15/2019: Self-directed in schoolwork generally A and B grades currently taking AP psychology planning considering herself undeserving for low grade or delayed assignment completion which is always B or better than average.  Patient wishes to have dual educational program next year in 11th grade with classes also at [employer]. She has been inflexible and constricted in repertoire of nutrition and activity, though she can spontaneously enjoy social activity with friends as recent as a year ago as well  as volunteering for special needs children to have Friday night activities before COVID.  Otherwise she has maintained only 1 friend though she enjoys the cat at home though having to clean after it.  Patient feels tormented by older sister who has a 25-month-old baby patient loves as with father or psychologist wanting her to gain weight to become healthy.   Social Determinants of Health   Financial Resource Strain: Not on file  Food Insecurity: No Food Insecurity (09/20/2021)   Hunger Vital Sign    Worried About Running Out of Food in the Last Year: Never true    Ran Out of Food in the Last Year: Never true  Transportation Needs: No Transportation Needs (09/20/2021)   PRAPARE - Administrator, Civil Service (Medical): No    Lack of Transportation (Non-Medical): No  Physical Activity: Not on file  Stress: Not on file  Social  Connections: Not on file  Intimate Partner Violence: Not on file    FAMILY HISTORY: Family History  Problem Relation Age of Onset   Depression Mother    OCD Mother    Obesity Mother    Asthma Brother    Stroke Maternal Uncle    Depression Maternal Grandmother    Diabetes Paternal Grandmother    Seizures Other    Migraines Neg Hx    Anxiety disorder Neg Hx    Bipolar disorder Neg Hx    Schizophrenia Neg Hx    ADD / ADHD Neg Hx    Autism Neg Hx     ALLERGIES:  has No Known Allergies.  MEDICATIONS:  Current Outpatient Medications  Medication Sig Dispense Refill   ARIPiprazole (ABILIFY) 2 MG tablet Take 1 tablet (2 mg total) by mouth daily. 90 tablet 3   cholecalciferol (VITAMIN D) 1000 units tablet Take 4,000 Units by mouth daily.     ferrous sulfate 324 (65 Fe) MG TBEC Take 1 tablet by mouth in the morning and 1 tablet in the evening with meals. If causes constipation, begin miralax daily as needed. Check Hgb/ferritin in 3 months.. 90 tablet 3   fludrocortisone (FLORINEF) 0.1 MG tablet Take 0.2 mg by mouth daily.     Magnesium Hydroxide 1200  MG CHEW Chew 1,200 mg by mouth daily.     Melatonin 10 MG TABS Take by mouth.     metoprolol tartrate (LOPRESSOR) 25 MG tablet TAKE 1 TABLET BY MOUTH ONCE A DAY 90 tablet 2   midodrine (PROAMATINE) 10 MG tablet Take 10 mg by mouth 3 (three) times daily.     norethindrone (MICRONOR) 0.35 MG tablet Take 1 tablet (0.35 mg total) by mouth daily. 84 tablet 5   senna (SENOKOT) 8.6 MG tablet Take 2 tablets by mouth daily.     sertraline (ZOLOFT) 100 MG tablet Take 2 tablets (200 mg total) by mouth daily. 180 tablet 3   traZODone (DESYREL) 50 MG tablet Take 1 tablet (50 mg total) by mouth at bedtime. 90 tablet 3   No current facility-administered medications for this visit.    REVIEW OF SYSTEMS:   Constitutional: Denies fevers, chills or abnormal night sweats Eyes: Denies blurriness of vision, double vision or watery eyes Ears, nose, mouth, throat, and face: Denies mucositis or sore throat Respiratory: Denies cough, dyspnea or wheezes Cardiovascular: Denies palpitation, chest discomfort or lower extremity swelling Gastrointestinal:  Denies nausea, heartburn or change in bowel habits Skin: Denies abnormal skin rashes Lymphatics: Denies new lymphadenopathy or easy bruising Neurological:Denies numbness, tingling or new weaknesses Behavioral/Psych: Mood is stable, no new changes  All other systems were reviewed with the patient and are negative.  PHYSICAL EXAMINATION: ECOG PERFORMANCE STATUS: 0 - Asymptomatic  Vitals:   10/28/22 1151  BP: (!) 101/59  Pulse: 67  Resp: 18  Temp: 98.4 F (36.9 C)  SpO2: 100%   Filed Weights   10/28/22 1151  Weight: 159 lb 12.8 oz (72.5 kg)    GENERAL:alert, no distress and comfortable SKIN: skin color, texture, turgor are normal, no rashes or significant lesions EYES: normal, conjunctiva are pink and non-injected, sclera clear OROPHARYNX:no exudate, no erythema and lips, buccal mucosa, and tongue normal  NECK: supple, thyroid normal size, non-tender,  without nodularity LYMPH:  no palpable lymphadenopathy in the cervical, axillary or inguinal LUNGS: clear to auscultation and percussion with normal breathing effort HEART: regular rate & rhythm and no murmurs and no lower extremity edema ABDOMEN:abdomen soft, non-tender  and normal bowel sounds Musculoskeletal:no cyanosis of digits and no clubbing  PSYCH: alert & oriented x 3 with fluent speech NEURO: no focal motor/sensory deficits

## 2022-10-28 NOTE — Assessment & Plan Note (Signed)
She have history of borderline iron deficiency but has not been anemic I recommend the patient to take oral iron supplement prior to food intake We will repeat CBC and iron studies If she is not anemic, the patient would not benefit from intravenous iron infusion I will call her with test results

## 2022-11-11 ENCOUNTER — Other Ambulatory Visit: Payer: Self-pay

## 2022-11-11 DIAGNOSIS — G901 Familial dysautonomia [Riley-Day]: Secondary | ICD-10-CM | POA: Diagnosis not present

## 2022-11-18 ENCOUNTER — Encounter: Payer: Self-pay | Admitting: Physical Therapy

## 2022-11-18 ENCOUNTER — Ambulatory Visit: Payer: 59 | Attending: Family Medicine | Admitting: Physical Therapy

## 2022-11-18 DIAGNOSIS — R279 Unspecified lack of coordination: Secondary | ICD-10-CM

## 2022-11-18 DIAGNOSIS — R1031 Right lower quadrant pain: Secondary | ICD-10-CM

## 2022-11-18 DIAGNOSIS — M6281 Muscle weakness (generalized): Secondary | ICD-10-CM | POA: Diagnosis not present

## 2022-11-18 NOTE — Patient Instructions (Signed)

## 2022-11-18 NOTE — Therapy (Signed)
OUTPATIENT PHYSICAL THERAPY TREATMENT NOTE   Patient Name: April Williams MRN: [id number] DOB:Dec 18, 2000, 22 y.o., female Today's Date: 11/18/2022  PCP: Tollie Eth, NP  REFERRING PROVIDER:  Federico Flake, MD   END OF SESSION:   PT End of Session - 11/18/22 0753     Visit Number 7    Date for PT Re-Evaluation 02/21/23    Authorization Type [facility]    PT Start Time 0800    PT Stop Time 0840    PT Time Calculation (min) 40 min    Activity Tolerance Patient tolerated treatment well    Behavior During Therapy [facility] for tasks assessed/performed             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    Irritable bowel syndrome    Nasal polyps    Obsessive-compulsive disorder    Physical deconditioning 04/21/2018   Plantar wart    POTS (postural orthostatic tachycardia syndrome)    Scoliosis of thoracolumbar spine    Vitamin D deficiency    Past Surgical History:  Procedure Laterality Date   NASAL SINUS SURGERY  06/12/2017   wisdom tooth removal     Patient Active Problem List   Diagnosis Date Noted   Family history of clotting disorder 10/28/2022   Iron deficiency 10/28/2022   Encounter for medical examination to establish care 03/21/2022   Ectopic atrial rhythm 06/13/2020   Irritable bowel syndrome    Obsessive compulsive disorder 06/15/2019   Persistent depressive disorder with melancholic features, currently moderate 06/15/2019   Avoidant-restrictive food intake disorder (ARFID) 06/15/2019   Adjustment disorder with mixed anxiety and depressed mood 07/28/2018   Lump of right breast 06/30/2018   Dysautonomia (HCC) 09/02/2017   Adolescent idiopathic scoliosis of thoracolumbar region 08/04/2017    Delayed sleep phase syndrome 08/01/2017   POTS (postural orthostatic tachycardia syndrome) 06/30/2017   Chronic daily headache 06/30/2017   Chronic pansinusitis 05/27/2017   REFERRING DIAG: K59.04 (ICD-10-CM) - Chronic idiopathic constipation    THERAPY DIAG:  Muscle weakness (generalized)   Unspecified lack of coordination   Right lower quadrant abdominal pain   Rationale for Evaluation and Treatment: Rehabilitation   ONSET DATE: 2021   SUBJECTIVE:  SUBJECTIVE STATEMENT: My right quadrant pain is less. Rectal pain is less. I am doing yoga now.    PAIN:  Are you having pain? Yes NPRS scale: 1-2/10, if rotate her waist to the right 8/10 Pain location:  right lower abdominal   Pain type: aching and dull Pain description: intermittent    Aggravating factors: certain foods, eating, sitting on commode with feet on squatty potty increases rectal pain Relieving factors: occasional enemas  PAIN:  Are you having pain? Yes: NPRS scale: 5/10 Pain location: rectum Pain description: sharp and goes posteriorly and goes to the tailbone Aggravating factors: passing stool Relieving factors: relax her muscles      PRECAUTIONS: None   WEIGHT BEARING RESTRICTIONS: No   FALLS:  Has patient fallen in last 6 months? No   LIVING ENVIRONMENT: Lives with: lives with their family     OCCUPATION: student   PLOF: Independent   PATIENT GOALS: reduce pain    PERTINENT HISTORY:  Dysautonomia; IBS; POTS; Scoliosis of Thoracolumbar spine; OCD   BOWEL MOVEMENT: Pain with bowel movement: Yes Type of bowel movement:Type (Bristol Stool Scale) Type 3, 6, 7, Frequency weekly, Strain Yes, and Splinting no Fully empty rectum: Yes Leakage: No Fiber supplement: stool softener and dulcolax, senna - sees GI in  [place]    URINATION: Pain with urination: No Fully empty bladder: Yes:   Stream: Strong Urgency: No Frequency: average Leakage:  none     INTERCOURSE:no active   PREGNANCY: no pregnancies       PROLAPSE: None     OBJECTIVE:    DIAGNOSTIC FINDINGS:  Pelvic floor strength II/V  PVR of 144 ml was obtained by bladder scan  No evidence of urethral prolapse Hymenal ring is slightly more prominent      COGNITION: Overall cognitive status: Within functional limits for tasks assessed                          SENSATION: Light touch: Appears intact Proprioception: Appears intact     POSTURE:  scoliosis in thoracolumbar area   PELVIC ALIGNMENT:   LUMBARAROM/PROM: full lumbar ROM     LOWER EXTREMITY ROM: Full bilateral hip ROM     LOWER EXTREMITY MMT:   MMT Right eval Left eval Right/left  08/02/22 Right/left 11/18/22  Hip abduction 4/5 4/5 4/5 4/5    PALPATION:   General  decreased mobility of the lower rib cage, Tightness of diaphragm, tenderness located on the right lower quadrant, lower abdominal tissue has fascial restrictions.                  External Perineal Exam tenderness along the coccygeus, external anal sphincter                             Internal Pelvic Floor tenderness located on the right obturator internist, right iliococcygeus, external and internal anal sphincter; her pelvic floor muscles relax when she brings her knees to chest. Not able to push therapist finger out of the anal canal.    Patient confirms identification and approves PT to assess internal pelvic floor and treatment Yes   PELVIC MMT:   MMT eval   Internal Anal Sphincter 2/5   External Anal Sphincter 2/5   Puborectalis 2/5   (Blank rows = not tested)         TONE: increased     TODAY'S TREATMENT:  11/18/22 Manual: Release  of the anococcygeal ligament with pressure anterior to the coccyx and patient performs pelvic tilts and sways Internal pelvic floor  techniques: No emotional/communication barriers or cognitive limitation. Patient is motivated to learn. Patient understands and agrees with treatment goals and plan. PT explains patient will be examined in standing, sitting, and lying down to see how their muscles and joints work. When they are ready, they will be asked to remove their underwear so PT can examine their perineum. The patient is also given the option of providing their own chaperone as one is not provided in our facility. The patient also has the right and is explained the right to defer or refuse any part of the evaluation or treatment including the internal exam. With the patient's consent, PT will use one gloved finger to gently assess the muscles of the pelvic floor, seeing how well it contracts and relaxes and if there is muscle symmetry. After, the patient will get dressed and PT and patient will discuss exam findings and plan of care. PT and patient discuss plan of care, schedule, attendance policy and HEP activities.  Outside the rectum performing manual work to the coccygeus, puborectalis, along the external anal sphincter, superior transverse perineum to relax the muscles and elongate after dry needling Trigger Point Dry-Needling  Treatment instructions: Expect mild to moderate muscle soreness. S/S of pneumothorax if dry needled over a lung field, and to seek immediate medical attention should they occur. Patient verbalized understanding of these instructions and education.  Patient Consent Given: Yes Education handout provided: Yes Muscles treated: puborectalis, coccygeus on the right Electrical stimulation performed: No Parameters: N/A Treatment response/outcome: elongation of muscle and trigger point response     09/23/22 Manual: Soft tissue mobilization: Circular massage to the abdomen to promote peristalic motion of the intestines Manual work to the diaphragm to improve mobility Myofascial release: Fascial release of  the lower abdomen, mesenteric root, along the suprapubic area, along the lower abdomen Exercises: Strengthening: Supine hip flexion isometrics holding 2 sec 10 x each side Bridge with ball squeeze 10 x and verbal cues to go slowly Sidely with transverse abdominus 5 times each side Quadruped abdominal contraction with weight shifting forward and backward 09/20/22 Electrotherapy: Electrical stimulation to the lower abdomen and S1 area for 20 minutes with heat to the lower abdomen for pain relief Manual: Soft tissue mobilization: Manual work to the gluteals, coccygeus, levator ani, SI joint, and around the anal sphincter muscles to lengthen and reduce pain. Patient would do hip movements to elongate the muscles with manual work.  Spinal mobilization: PA mobilization to L3-L5 grade 3     PATIENT EDUCATION:  11/18/22 Education details: Access Code: [email or web address];information on dry needling Person educated: Patient Education method: Explanation, Demonstration, Tactile cues, Verbal cues, Handouts, and you tube video Education comprehension: verbalized understanding, returned demonstration, verbal cues required, tactile cues required, and needs further education   HOME EXERCISE PROGRAM: 09/23/22 Access Code: [email or web address] URL: [email or web address] Date: 09/23/2022 Prepared by: Eulis Foster  Program Notes sit on ball and massage pelvic floor  Exercises - - Hooklying Isometric Hip Flexion  - 1 x daily - 3 x weekly - 1 sets - 10 reps - 2 sec hold - Supine Bridge with Mini Swiss Ball Between Knees  - 1 x daily - 3 x weekly - 1 sets - 10 reps - Sidelying Transversus Abdominis Bracing  - 1 x daily - 3 x weekly - 1 sets - 5 reps - Quadruped Forward Weight Shift  - 1  x daily - 3 x weekly - 1 sets - 10 reps   ASSESSMENT:   CLINICAL IMPRESSION: Patient is a 22 y.o. female who was seen today for physical therapy  treatment for constipation. Patient is able to fully empty her rectum. Patient  strains 15% less when having a bowel movement but will get a pain at 5/10 that is sharp. Abdominal pain is 2/10 compared to 4/10.  She responded well to the dry needling and relaxation of the muscles. She has weakness in the hip abductors. She is still working on contracting her lower abdominals. Patient will benefit from skilled therapy to improve coordination and relaxation of her muscles to reduce pain and constipation.    OBJECTIVE IMPAIRMENTS: decreased activity tolerance, decreased endurance, decreased strength, increased fascial restrictions, increased muscle spasms, and pain.    ACTIVITY LIMITATIONS: toileting   PARTICIPATION LIMITATIONS: community activity   PERSONAL FACTORS: Age, Time since onset of injury/illness/exacerbation, and 3+ comorbidities: Dysautonomia; IBS; POTS; Scoliosis of Thoracolumbar spine; OCD  are also affecting patient's functional outcome.    REHAB POTENTIAL: Excellent   CLINICAL DECISION MAKING: Evolving/moderate complexity   EVALUATION COMPLEXITY: Moderate     GOALS: Goals reviewed with patient? Yes   SHORT TERM GOALS: Target date: 07/11/22   Patient is able to demonstrate correct toileting technique to have a bowel movement.  Baseline: still working on the breath work Goal status: ongoing 08/02/22   2.  Patient understands how to perform diaphragmatic breathing with expansion of her rib cage to relax her pelvic floor.  Baseline:  Goal status: Met 08/02/22   3.  Patient understands how to perform abdominal massage to assist with peristalic motion of the intestines.  Baseline:  Goal status: Met 08/02/22   4.  Patient reports her pain reduced >/= 25% due to reduction of trigger points and increased in muscle relaxation.  Baseline: decreased by 20% Goal status: Met 09/23/22    LONG TERM GOALS: Target date: 02/21/23   Patient is independent with advanced HEP to improve pelvic floor coordination for a bowel movement.  Baseline:  Goal status: ongoing  11/18/22   2.  Patient is able to contract the lower abdominals equally with the upper abdominals so she is able to generate correct force to push stool out.  Baseline:  Goal status: ongoing 11/18/22   3.  Patient pelvic floor muscles are relaxed  and reduced trigger points so her pain decreased >/= 75%.  Baseline:  Goal status: ongoing 11/18/22   4.  Patient is able to have a bowel movement 2 times per week due to improve intestinal mobility and relaxation of the pelvic floor.  Baseline:  Goal status: ongoing 11/18/22   5.  Patient is able to push the therapist finger out of the rectum due to improve coordination of her pelvic floor.  Baseline:  Goal status: ongoing 11/18/22       PLAN:   PT FREQUENCY: 1x/week   PT DURATION: 12 weeks   PLANNED INTERVENTIONS: Therapeutic exercises, Therapeutic activity, Neuromuscular re-education, Patient/Family education, Joint mobilization, Dry Needling, Electrical stimulation, Spinal mobilization, Cryotherapy, Moist heat, Taping, Ultrasound, Biofeedback, and Manual therapy   PLAN FOR NEXT SESSION: see how the dry needling went, work on core,  work on pushing the therapist finger out of the rectum   Eulis Foster, PT 11/18/22 8:46 AM

## 2022-11-19 ENCOUNTER — Other Ambulatory Visit (HOSPITAL_COMMUNITY): Payer: Self-pay

## 2022-11-19 DIAGNOSIS — G909 Disorder of the autonomic nervous system, unspecified: Secondary | ICD-10-CM | POA: Diagnosis not present

## 2022-11-19 DIAGNOSIS — G901 Familial dysautonomia [Riley-Day]: Secondary | ICD-10-CM | POA: Diagnosis not present

## 2022-11-20 ENCOUNTER — Other Ambulatory Visit (HOSPITAL_COMMUNITY): Payer: Self-pay

## 2022-11-21 ENCOUNTER — Other Ambulatory Visit: Payer: Self-pay

## 2022-11-21 ENCOUNTER — Other Ambulatory Visit (HOSPITAL_COMMUNITY): Payer: Self-pay

## 2022-11-21 MED ORDER — MIDODRINE HCL 5 MG PO TABS
10.0000 mg | ORAL_TABLET | Freq: Three times a day (TID) | ORAL | 3 refills | Status: AC
  Filled 2023-02-02: qty 540, 90d supply, fill #0
  Filled 2023-05-10 – 2023-05-23 (×2): qty 540, 90d supply, fill #1
  Filled 2023-09-14: qty 540, 90d supply, fill #2

## 2022-11-21 MED ORDER — MIDODRINE HCL 10 MG PO TABS
10.0000 mg | ORAL_TABLET | Freq: Three times a day (TID) | ORAL | 3 refills | Status: DC
  Filled 2022-11-21: qty 270, 90d supply, fill #0
  Filled 2022-12-12: qty 270, 90d supply, fill #1

## 2022-11-22 ENCOUNTER — Other Ambulatory Visit (HOSPITAL_COMMUNITY): Payer: Self-pay

## 2022-11-22 ENCOUNTER — Other Ambulatory Visit: Payer: Self-pay

## 2022-11-29 ENCOUNTER — Ambulatory Visit: Payer: 59 | Attending: Family Medicine | Admitting: Physical Therapy

## 2022-11-29 ENCOUNTER — Encounter: Payer: Self-pay | Admitting: Physical Therapy

## 2022-11-29 DIAGNOSIS — R1031 Right lower quadrant pain: Secondary | ICD-10-CM | POA: Diagnosis not present

## 2022-11-29 DIAGNOSIS — M6281 Muscle weakness (generalized): Secondary | ICD-10-CM | POA: Diagnosis not present

## 2022-11-29 DIAGNOSIS — R279 Unspecified lack of coordination: Secondary | ICD-10-CM | POA: Insufficient documentation

## 2022-11-29 NOTE — Therapy (Signed)
OUTPATIENT PHYSICAL THERAPY TREATMENT NOTE   Patient Name: April Williams MRN: [id number] DOB:[birth year], 22 y.o., female Today's Date: 11/29/2022  PCP: Tollie Eth, NP  REFERRING PROVIDER:  Federico Flake, MD   END OF SESSION:   PT End of Session - 11/29/22 0901     Visit Number 8    Date for PT Re-Evaluation 02/21/23    Authorization Type [facility]    PT Start Time 0900    PT Stop Time 0945    PT Time Calculation (min) 45 min    Activity Tolerance Patient tolerated treatment well    Behavior During Therapy [facility] for tasks assessed/performed             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    Irritable bowel syndrome    Nasal polyps    Obsessive-compulsive disorder    Physical deconditioning 04/21/2018   Plantar wart    POTS (postural orthostatic tachycardia syndrome)    Scoliosis of thoracolumbar spine    Vitamin D deficiency    Past Surgical History:  Procedure Laterality Date   NASAL SINUS SURGERY  06/12/2017   wisdom tooth removal     Patient Active Problem List   Diagnosis Date Noted   Family history of clotting disorder 10/28/2022   Iron deficiency 10/28/2022   Encounter for medical examination to establish care 03/21/2022   Ectopic atrial rhythm 06/13/2020   Irritable bowel syndrome    Obsessive compulsive disorder 06/15/2019   Persistent depressive disorder with melancholic features, currently moderate 06/15/2019   Avoidant-restrictive food intake disorder (ARFID) 06/15/2019   Adjustment disorder with mixed anxiety and depressed mood 07/28/2018   Lump of right breast 06/30/2018   Dysautonomia (HCC) 09/02/2017   Adolescent idiopathic scoliosis of thoracolumbar region 08/04/2017    Delayed sleep phase syndrome 08/01/2017   POTS (postural orthostatic tachycardia syndrome) 06/30/2017   Chronic daily headache 06/30/2017   Chronic pansinusitis 05/27/2017   REFERRING DIAG: K59.04 (ICD-10-CM) - Chronic idiopathic constipation    THERAPY DIAG:  Muscle weakness (generalized)   Unspecified lack of coordination   Right lower quadrant abdominal pain   Rationale for Evaluation and Treatment: Rehabilitation   ONSET DATE: 2021   SUBJECTIVE:  SUBJECTIVE STATEMENT: I felt tingling in the rectal area. It improved the rectal pain. I am having less constipation. I am having more loose stool. I go to the bathroom 1 time per week but is not as painful.  Marland Kitchen    PAIN:  Are you having pain? Yes NPRS scale: 1-2/10, if rotate her waist to the right 8/10 Pain location:  right lower abdominal   Pain type: aching and dull Pain description: intermittent    Aggravating factors: certain foods, eating, sitting on commode with feet on squatty potty increases rectal pain Relieving factors: occasional enemas  PAIN:  Are you having pain? Yes: NPRS scale: 3/10 Pain location: rectum Pain description: sharp and goes posteriorly and goes to the tailbone Aggravating factors: passing stool Relieving factors: relax her muscles      PRECAUTIONS: None   WEIGHT BEARING RESTRICTIONS: No   FALLS:  Has patient fallen in last 6 months? No   LIVING ENVIRONMENT: Lives with: lives with their family     OCCUPATION: student   PLOF: Independent   PATIENT GOALS: reduce pain    PERTINENT HISTORY:  Dysautonomia; IBS; POTS; Scoliosis of Thoracolumbar spine; OCD   BOWEL MOVEMENT: Pain with bowel movement: Yes Type of bowel movement:Type (Bristol Stool Scale) Type 3, 6, 7, Frequency weekly, Strain Yes, and  Splinting no Fully empty rectum: Yes Leakage: No Fiber supplement: stool softener and dulcolax, senna - sees GI in [place]    URINATION: Pain with urination: No Fully empty bladder: Yes:   Stream: Strong Urgency: No Frequency: average Leakage:  none     INTERCOURSE:no active   PREGNANCY: no pregnancies       PROLAPSE: None     OBJECTIVE:    DIAGNOSTIC FINDINGS:  Pelvic floor strength II/V  PVR of 144 ml was obtained by bladder scan  No evidence of urethral prolapse Hymenal ring is slightly more prominent      COGNITION: Overall cognitive status: Within functional limits for tasks assessed                          SENSATION: Light touch: Appears intact Proprioception: Appears intact     POSTURE:  scoliosis in thoracolumbar area     LUMBARAROM/PROM: full lumbar ROM     LOWER EXTREMITY ROM: Full bilateral hip ROM     LOWER EXTREMITY MMT:   MMT Right eval Left eval Right/left  08/02/22 Right/left 11/18/22  Hip abduction 4/5 4/5 4/5 4/5    PALPATION:   General  decreased mobility of the lower rib cage, Tightness of diaphragm, tenderness located on the right lower quadrant, lower abdominal tissue has fascial restrictions.                  External Perineal Exam tenderness along the coccygeus, external anal sphincter                             Internal Pelvic Floor tenderness located on the right obturator internist, right iliococcygeus, external and internal anal sphincter; her pelvic floor muscles relax when she brings her knees to chest. Not able to push therapist finger out of the anal canal.    Patient confirms identification and approves PT to assess internal pelvic floor and treatment Yes   PELVIC MMT:   MMT eval   Internal Anal Sphincter 2/5   External Anal Sphincter 2/5   Puborectalis 2/5   (Blank rows =  not tested)         TONE: increased     TODAY'S TREATMENT:  11/29/22 Manual: Soft tissue mobilization: To assess for dry  needling Myofascial release: Fascial release along the perineal body, superior transverse perineum,  Trigger Point Dry-Needling  Treatment instructions: Expect mild to moderate muscle soreness. S/S of pneumothorax if dry needled over a lung field, and to seek immediate medical attention should they occur. Patient verbalized understanding of these instructions and education.  Patient Consent Given: Yes Education handout provided: Yes Muscles treated: puborectalis, coccygeus on the right, perineal body, superior transverse Electrical stimulation performed: No Parameters: N/A Treatment response/outcome: elongation of muscle and trigger point response Neuromuscular re-education: Down training: Diaphragmatic breathing with opening up the anus to facilitate her having a bowel movement laying on her right side Exercises: Strengthening: Supine march with pressing hands into the mat, engaging her abdominals, going slowly 10 x each leg Supine march pressing into the medial knee to engage the obliques 10 x each Quadruped lift arm 10 x each Quadruped slide foot outward without letting hip drop 5 x each    11/18/22 Manual: Release of the anococcygeal ligament with pressure anterior to the coccyx and patient performs pelvic tilts and sways Internal pelvic floor techniques: No emotional/communication barriers or cognitive limitation. Patient is motivated to learn. Patient understands and agrees with treatment goals and plan. PT explains patient will be examined in standing, sitting, and lying down to see how their muscles and joints work. When they are ready, they will be asked to remove their underwear so PT can examine their perineum. The patient is also given the option of providing their own chaperone as one is not provided in our facility. The patient also has the right and is explained the right to defer or refuse any part of the evaluation or treatment including the internal exam. With the  patient's consent, PT will use one gloved finger to gently assess the muscles of the pelvic floor, seeing how well it contracts and relaxes and if there is muscle symmetry. After, the patient will get dressed and PT and patient will discuss exam findings and plan of care. PT and patient discuss plan of care, schedule, attendance policy and HEP activities.  Outside the rectum performing manual work to the coccygeus, puborectalis, along the external anal sphincter, superior transverse perineum to relax the muscles and elongate after dry needling Trigger Point Dry-Needling  Treatment instructions: Expect mild to moderate muscle soreness. S/S of pneumothorax if dry needled over a lung field, and to seek immediate medical attention should they occur. Patient verbalized understanding of these instructions and education.  Patient Consent Given: Yes Education handout provided: Yes Muscles treated: puborectalis, coccygeus on the right Electrical stimulation performed: No Parameters: N/A Treatment response/outcome: elongation of muscle and trigger point response     09/23/22 Manual: Soft tissue mobilization: Circular massage to the abdomen to promote peristalic motion of the intestines Manual work to the diaphragm to improve mobility Myofascial release: Fascial release of the lower abdomen, mesenteric root, along the suprapubic area, along the lower abdomen Exercises: Strengthening: Supine hip flexion isometrics holding 2 sec 10 x each side Bridge with ball squeeze 10 x and verbal cues to go slowly Sidely with transverse abdominus 5 times each side Quadruped abdominal contraction with weight shifting forward and backward    PATIENT EDUCATION:  11/29/22 Education details: Access Code: [email or web address];information on dry needling Person educated: Patient Education method: Explanation, Demonstration, Tactile cues, Verbal cues, Handouts, and you  tube video Education comprehension: verbalized understanding,  returned demonstration, verbal cues required, tactile cues required, and needs further education   HOME EXERCISE PROGRAM: 11/29/22 Access Code: [email or web address] URL: [email or web address] Date: 11/29/2022 Prepared by: Eulis Foster  Program Notes sit on ball and massage pelvic floor  Exercises - Seated Diaphragmatic Breathing  - 2 x daily - 7 x weekly - 1 sets - 10 reps - Sidelying Thoracic Rotation with Open Book  - 1 x daily - 7 x weekly - 3 sets - 10 reps - Thoracic Sidebending with Towel Roll  - 1 x daily - 7 x weekly - 1 sets - 10 reps - Supine Bridge with Mini Swiss Ball Between Knees  - 1 x daily - 3 x weekly - 1 sets - 10 reps - Sidelying Transversus Abdominis Bracing  - 1 x daily - 3 x weekly - 1 sets - 5 reps - Quadruped Forward Weight Shift  - 1 x daily - 3 x weekly - 1 sets - 10 reps - Supine March  - 1 x daily - 3 x weekly - 2 sets - 10 reps - Hooklying Isometric Hip Flexion with Opposite Arm  - 1 x daily - 3 x weekly - 2 sets - 10 reps - Quadruped Exhale with Pelvic Floor Contraction and Arm Raise  - 1 x daily - 3 x weekly - 1 sets - 10 reps - Quadruped Exhale with Pelvic Floor Contraction and Leg Extension  - 1 x daily - 3 x weekly - 1 sets - 5 reps   ASSESSMENT:   CLINICAL IMPRESSION: Patient is a 23 y.o. female who was seen today for physical therapy  treatment for constipation. Patient is able to fully empty her rectum. Patient strains 15% less when having a bowel movement but will get a pain at 3/10 instead of 5/10 that is sharp. Abdominal pain is 2/10 compared to 4/10.  She was able was able to keep her anus open when breathing out by the end of the treatment to simulate a bowel movement. Patient is still having 1 bowel movement per week.  Patient will benefit from skilled therapy to improve coordination and relaxation of her muscles to reduce pain and constipation.    OBJECTIVE IMPAIRMENTS: decreased activity tolerance, decreased endurance, decreased strength,  increased fascial restrictions, increased muscle spasms, and pain.    ACTIVITY LIMITATIONS: toileting   PARTICIPATION LIMITATIONS: community activity   PERSONAL FACTORS: Age, Time since onset of injury/illness/exacerbation, and 3+ comorbidities: Dysautonomia; IBS; POTS; Scoliosis of Thoracolumbar spine; OCD  are also affecting patient's functional outcome.    REHAB POTENTIAL: Excellent   CLINICAL DECISION MAKING: Evolving/moderate complexity   EVALUATION COMPLEXITY: Moderate     GOALS: Goals reviewed with patient? Yes   SHORT TERM GOALS: Target date: 07/11/22   Patient is able to demonstrate correct toileting technique to have a bowel movement.  Baseline: still working on the breath work Goal status: ongoing 08/02/22   2.  Patient understands how to perform diaphragmatic breathing with expansion of her rib cage to relax her pelvic floor.  Baseline:  Goal status: Met 08/02/22   3.  Patient understands how to perform abdominal massage to assist with peristalic motion of the intestines.  Baseline:  Goal status: Met 08/02/22   4.  Patient reports her pain reduced >/= 25% due to reduction of trigger points and increased in muscle relaxation.  Baseline: decreased by 20% Goal status: Met 09/23/22    LONG TERM GOALS: Target date: 02/21/23  Patient is independent with advanced HEP to improve pelvic floor coordination for a bowel movement.  Baseline:  Goal status: ongoing 11/18/22   2.  Patient is able to contract the lower abdominals equally with the upper abdominals so she is able to generate correct force to push stool out.  Baseline:  Goal status: ongoing 11/18/22   3.  Patient pelvic floor muscles are relaxed  and reduced trigger points so her pain decreased >/= 75%.  Baseline:  Goal status: ongoing 11/18/22   4.  Patient is able to have a bowel movement 2 times per week due to improve intestinal mobility and relaxation of the pelvic floor.  Baseline:  Goal status: ongoing  11/18/22   5.  Patient is able to push the therapist finger out of the rectum due to improve coordination of her pelvic floor.  Baseline:  Goal status: ongoing 11/18/22       PLAN:   PT FREQUENCY: 1x/week   PT DURATION: 12 weeks   PLANNED INTERVENTIONS: Therapeutic exercises, Therapeutic activity, Neuromuscular re-education, Patient/Family education, Joint mobilization, Dry Needling, Electrical stimulation, Spinal mobilization, Cryotherapy, Moist heat, Taping, Ultrasound, Biofeedback, and Manual therapy   PLAN FOR NEXT SESSION:  dry needling  to left pelvic floor, work on core,  work on pushing the therapist finger out of the rectum   Eulis Foster, PT 11/29/22 9:55 AM

## 2022-12-12 ENCOUNTER — Other Ambulatory Visit (HOSPITAL_COMMUNITY): Payer: Self-pay

## 2022-12-12 ENCOUNTER — Other Ambulatory Visit: Payer: Self-pay

## 2022-12-23 ENCOUNTER — Encounter: Payer: Self-pay | Admitting: Physical Therapy

## 2022-12-23 ENCOUNTER — Ambulatory Visit: Payer: 59 | Admitting: Physical Therapy

## 2022-12-23 DIAGNOSIS — R1031 Right lower quadrant pain: Secondary | ICD-10-CM | POA: Diagnosis not present

## 2022-12-23 DIAGNOSIS — R279 Unspecified lack of coordination: Secondary | ICD-10-CM

## 2022-12-23 DIAGNOSIS — M6281 Muscle weakness (generalized): Secondary | ICD-10-CM

## 2022-12-23 NOTE — Therapy (Signed)
OUTPATIENT PHYSICAL THERAPY TREATMENT NOTE   Patient Name: April Williams MRN: [id number] DOB:2000-10-14, 22 y.o., female 48 Date: 12/23/2022  PCP: Tollie Eth, NP  REFERRING PROVIDER:  Federico Flake, MD   END OF SESSION:   PT End of Session - 12/23/22 1229     Visit Number 9    Date for PT Re-Evaluation 02/21/23    Authorization Type [facility]    PT Start Time 1230    PT Stop Time 1310    PT Time Calculation (min) 40 min    Activity Tolerance Patient tolerated treatment well    Behavior During Therapy [facility] for tasks assessed/performed             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    Irritable bowel syndrome    Nasal polyps    Obsessive-compulsive disorder    Physical deconditioning 04/21/2018   Plantar wart    POTS (postural orthostatic tachycardia syndrome)    Scoliosis of thoracolumbar spine    Vitamin D deficiency    Past Surgical History:  Procedure Laterality Date   NASAL SINUS SURGERY  06/12/2017   wisdom tooth removal     Patient Active Problem List   Diagnosis Date Noted   Family history of clotting disorder 10/28/2022   Iron deficiency 10/28/2022   Encounter for medical examination to establish care 03/21/2022   Ectopic atrial rhythm 06/13/2020   Irritable bowel syndrome    Obsessive compulsive disorder 06/15/2019   Persistent depressive disorder with melancholic features, currently moderate 06/15/2019   Avoidant-restrictive food intake disorder (ARFID) 06/15/2019   Adjustment disorder with mixed anxiety and depressed mood 07/28/2018   Lump of right breast 06/30/2018   Dysautonomia (HCC) 09/02/2017   Adolescent idiopathic scoliosis of thoracolumbar region 08/04/2017    Delayed sleep phase syndrome 08/01/2017   POTS (postural orthostatic tachycardia syndrome) 06/30/2017   Chronic daily headache 06/30/2017   Chronic pansinusitis 05/27/2017   REFERRING DIAG: K59.04 (ICD-10-CM) - Chronic idiopathic constipation    THERAPY DIAG:  Muscle weakness (generalized)   Unspecified lack of coordination   Right lower quadrant abdominal pain   Rationale for Evaluation and Treatment: Rehabilitation   ONSET DATE: 2021   SUBJECTIVE:  SUBJECTIVE STATEMENT: My body was relaxed and stomach is tighter. Right now I am having diarrhea. I am not straining right now due to the diarrhea.   Marland Kitchen    PAIN:  Are you having pain? Yes NPRS scale: 3/10, if rotate her waist to the right 0/10 Pain location:  right lower abdominal   Pain type: aching and dull Pain description: intermittent    Aggravating factors: certain foods, eating, sitting on commode with feet on squatty potty increases rectal pain Relieving factors: occasional enemas  PAIN:  Are you having pain? Yes: NPRS scale: 3/10 Pain location: rectum Pain description: sharp and goes posteriorly and goes to the tailbone Aggravating factors: passing stool Relieving factors: relax her muscles      PRECAUTIONS: None   WEIGHT BEARING RESTRICTIONS: No   FALLS:  Has patient fallen in last 6 months? No   LIVING ENVIRONMENT: Lives with: lives with their family     OCCUPATION: student   PLOF: Independent   PATIENT GOALS: reduce pain    PERTINENT HISTORY:  Dysautonomia; IBS; POTS; Scoliosis of Thoracolumbar spine; OCD   BOWEL MOVEMENT: Pain with bowel movement: Yes Type of bowel movement:Type (Bristol Stool Scale) Type 3, 6, 7, Frequency weekly, Strain Yes, and Splinting no Fully empty rectum: Yes Leakage: No Fiber  supplement: stool softener and dulcolax, senna - sees GI in [place]    URINATION: Pain with urination: No Fully empty bladder: Yes:   Stream: Strong Urgency: No Frequency: average Leakage:  none     INTERCOURSE:no active   PREGNANCY: no pregnancies       PROLAPSE: None     OBJECTIVE:    DIAGNOSTIC FINDINGS:  Pelvic floor strength II/V  PVR of 144 ml was obtained by bladder scan  No evidence of urethral prolapse Hymenal ring is slightly more prominent      COGNITION: Overall cognitive status: Within functional limits for tasks assessed                          SENSATION: Light touch: Appears intact Proprioception: Appears intact     POSTURE:  scoliosis in thoracolumbar area     LUMBARAROM/PROM: full lumbar ROM     LOWER EXTREMITY ROM: Full bilateral hip ROM     LOWER EXTREMITY MMT:   MMT Right eval Left eval Right/left  08/02/22 Right/left 11/18/22  Hip abduction 4/5 4/5 4/5 4/5    PALPATION:   General  decreased mobility of the lower rib cage, Tightness of diaphragm, tenderness located on the right lower quadrant, lower abdominal tissue has fascial restrictions.                  External Perineal Exam tenderness along the coccygeus, external anal sphincter                             Internal Pelvic Floor tenderness located on the right obturator internist, right iliococcygeus, external and internal anal sphincter; her pelvic floor muscles relax when she brings her knees to chest. Not able to push therapist finger out of the anal canal.    Patient confirms identification and approves PT to assess internal pelvic floor and treatment Yes   PELVIC MMT:   MMT eval   Internal Anal Sphincter 2/5   External Anal Sphincter 2/5   Puborectalis 2/5   (Blank rows = not tested)         TONE: increased  TODAY'S TREATMENT:  12/23/22 Manual: Myofascial release: Fascial release around the anus to work on the trigger points.  Internal pelvic floor  techniques: No emotional/communication barriers or cognitive limitation. Patient is motivated to learn. Patient understands and agrees with treatment goals and plan. PT explains patient will be examined in standing, sitting, and lying down to see how their muscles and joints work. When they are ready, they will be asked to remove their underwear so PT can examine their perineum. The patient is also given the option of providing their own chaperone as one is not provided in our facility. The patient also has the right and is explained the right to defer or refuse any part of the evaluation or treatment including the internal exam. With the patient's consent, PT will use one gloved finger to gently assess the muscles of the pelvic floor, seeing how well it contracts and relaxes and if there is muscle symmetry. After, the patient will get dressed and PT and patient will discuss exam findings and plan of care. PT and patient discuss plan of care, schedule, attendance policy and HEP activities.  Go through the rectum performing manual work to the internal and external anal sphincter Manual work on the outside of the rectum working on the tissue to [employer] Dry-Needling  Treatment instructions: Expect mild to moderate muscle soreness. S/S of pneumothorax if dry needled over a lung field, and to seek immediate medical attention should they occur. Patient verbalized understanding of these instructions and education.  Patient Consent Given: Yes Education handout provided: Yes Muscles treated: external anal sphincter Electrical stimulation performed: No Parameters: N/A Treatment response/outcome: elongation of muscle and trigger point response Neuromuscular re-education: Pelvic floor contraction training:  Diaphragmatic breathing to open the anus with therapist finger at the anus Breathing into the hand to increase pressure to push stool out without increasing contraction of the anus.   Exercises: Strengthening: Transverse abdominus contraction 10 times Supine marching with lower abdominal contraction going slowly and controlled Standing Pallof with green band and tactile cues to assist with lower abdomen contraction Plank with hands on the wall and shoulder flexion keeping the lower abdominal  contraction   11/29/22 Manual: Soft tissue mobilization: To assess for dry needling Myofascial release: Fascial release along the perineal body, superior transverse perineum,  Trigger Point Dry-Needling  Treatment instructions: Expect mild to moderate muscle soreness. S/S of pneumothorax if dry needled over a lung field, and to seek immediate medical attention should they occur. Patient verbalized understanding of these instructions and education.  Patient Consent Given: Yes Education handout provided: Yes Muscles treated: puborectalis, coccygeus on the right, perineal body, superior transverse Electrical stimulation performed: No Parameters: N/A Treatment response/outcome: elongation of muscle and trigger point response Neuromuscular re-education: Down training: Diaphragmatic breathing with opening up the anus to facilitate her having a bowel movement laying on her right side Exercises: Strengthening: Supine march with pressing hands into the mat, engaging her abdominals, going slowly 10 x each leg Supine march pressing into the medial knee to engage the obliques 10 x each Quadruped lift arm 10 x each Quadruped slide foot outward without letting hip drop 5 x each    11/18/22 Manual: Release of the anococcygeal ligament with pressure anterior to the coccyx and patient performs pelvic tilts and sways Internal pelvic floor techniques: No emotional/communication barriers or cognitive limitation. Patient is motivated to learn. Patient understands and agrees with treatment goals and plan. PT explains patient will be examined in standing, sitting,  and lying down to see how  their muscles and joints work. When they are ready, they will be asked to remove their underwear so PT can examine their perineum. The patient is also given the option of providing their own chaperone as one is not provided in our facility. The patient also has the right and is explained the right to defer or refuse any part of the evaluation or treatment including the internal exam. With the patient's consent, PT will use one gloved finger to gently assess the muscles of the pelvic floor, seeing how well it contracts and relaxes and if there is muscle symmetry. After, the patient will get dressed and PT and patient will discuss exam findings and plan of care. PT and patient discuss plan of care, schedule, attendance policy and HEP activities.  Outside the rectum performing manual work to the coccygeus, puborectalis, along the external anal sphincter, superior transverse perineum to relax the muscles and elongate after dry needling Trigger Point Dry-Needling  Treatment instructions: Expect mild to moderate muscle soreness. S/S of pneumothorax if dry needled over a lung field, and to seek immediate medical attention should they occur. Patient verbalized understanding of these instructions and education.  Patient Consent Given: Yes Education handout provided: Yes Muscles treated: puborectalis, coccygeus on the right Electrical stimulation performed: No Parameters: N/A Treatment response/outcome: elongation of muscle and trigger point response      PATIENT EDUCATION:  11/29/22 Education details: Access Code: [email or web address];information on dry needling Person educated: Patient Education method: Explanation, Demonstration, Tactile cues, Verbal cues, Handouts, and you tube video Education comprehension: verbalized understanding, returned demonstration, verbal cues required, tactile cues required, and needs further education   HOME EXERCISE PROGRAM: 11/29/22 Access Code: [email or web address] URL:  [email or web address] Date: 11/29/2022 Prepared by: Eulis Foster  Program Notes sit on ball and massage pelvic floor  Exercises - Seated Diaphragmatic Breathing  - 2 x daily - 7 x weekly - 1 sets - 10 reps - Sidelying Thoracic Rotation with Open Book  - 1 x daily - 7 x weekly - 3 sets - 10 reps - Thoracic Sidebending with Towel Roll  - 1 x daily - 7 x weekly - 1 sets - 10 reps - Supine Bridge with Mini Swiss Ball Between Knees  - 1 x daily - 3 x weekly - 1 sets - 10 reps - Sidelying Transversus Abdominis Bracing  - 1 x daily - 3 x weekly - 1 sets - 5 reps - Quadruped Forward Weight Shift  - 1 x daily - 3 x weekly - 1 sets - 10 reps - Supine March  - 1 x daily - 3 x weekly - 2 sets - 10 reps - Hooklying Isometric Hip Flexion with Opposite Arm  - 1 x daily - 3 x weekly - 2 sets - 10 reps - Quadruped Exhale with Pelvic Floor Contraction and Arm Raise  - 1 x daily - 3 x weekly - 1 sets - 10 reps - Quadruped Exhale with Pelvic Floor Contraction and Leg Extension  - 1 x daily - 3 x weekly - 1 sets - 5 reps   ASSESSMENT:   CLINICAL IMPRESSION: Patient is a 22 y.o. female who was seen today for physical therapy  treatment for constipation. Patient is able to fully empty her rectum. Patient is able to rotate trunk to the right with no pain compared to before that was 8/10.   Patient is having bowel movement every other day with diarrhea. Patient still needs tactile cue  engage the lower abdomen. Patient still has difficulty with pushing therapist finger out of the rectum. Patient will benefit from skilled therapy to improve coordination and relaxation of her muscles to reduce pain and constipation.    OBJECTIVE IMPAIRMENTS: decreased activity tolerance, decreased endurance, decreased strength, increased fascial restrictions, increased muscle spasms, and pain.    ACTIVITY LIMITATIONS: toileting   PARTICIPATION LIMITATIONS: community activity   PERSONAL FACTORS: Age, Time since onset  of injury/illness/exacerbation, and 3+ comorbidities: Dysautonomia; IBS; POTS; Scoliosis of Thoracolumbar spine; OCD  are also affecting patient's functional outcome.    REHAB POTENTIAL: Excellent   CLINICAL DECISION MAKING: Evolving/moderate complexity   EVALUATION COMPLEXITY: Moderate     GOALS: Goals reviewed with patient? Yes   SHORT TERM GOALS: Target date: 07/11/22   Patient is able to demonstrate correct toileting technique to have a bowel movement.  Baseline: still working on the breath work Goal status: ongoing 08/02/22   2.  Patient understands how to perform diaphragmatic breathing with expansion of her rib cage to relax her pelvic floor.  Baseline:  Goal status: Met 08/02/22   3.  Patient understands how to perform abdominal massage to assist with peristalic motion of the intestines.  Baseline:  Goal status: Met 08/02/22   4.  Patient reports her pain reduced >/= 25% due to reduction of trigger points and increased in muscle relaxation.  Baseline: decreased by 20% Goal status: Met 09/23/22    LONG TERM GOALS: Target date: 02/21/23   Patient is independent with advanced HEP to improve pelvic floor coordination for a bowel movement.  Baseline:  Goal status: ongoing 11/18/22   2.  Patient is able to contract the lower abdominals equally with the upper abdominals so she is able to generate correct force to push stool out.  Baseline:  Goal status: ongoing 11/18/22   3.  Patient pelvic floor muscles are relaxed  and reduced trigger points so her pain decreased >/= 75%.  Baseline:  Goal status: ongoing 11/18/22   4.  Patient is able to have a bowel movement 2 times per week due to improve intestinal mobility and relaxation of the pelvic floor.  Baseline: every other day with diarrhea, 1-2 times per week with solid stool Goal status: ongoing 12/23/22   5.  Patient is able to push the therapist finger out of the rectum due to improve coordination of her pelvic floor.   Baseline:  Goal status: ongoing 11/18/22       PLAN:   PT FREQUENCY: 1x/week   PT DURATION: 12 weeks   PLANNED INTERVENTIONS: Therapeutic exercises, Therapeutic activity, Neuromuscular re-education, Patient/Family education, Joint mobilization, Dry Needling, Electrical stimulation, Spinal mobilization, Cryotherapy, Moist heat, Taping, Ultrasound, Biofeedback, and Manual therapy   PLAN FOR NEXT SESSION:  dry needling  to left pelvic floor, work on core,  work on pushing the therapist finger out of the rectum; RUSI   Eulis Foster, PT 12/23/22 1:24 PM

## 2022-12-30 ENCOUNTER — Encounter: Payer: Self-pay | Admitting: Physical Therapy

## 2022-12-30 ENCOUNTER — Ambulatory Visit: Payer: 59 | Attending: Family Medicine | Admitting: Physical Therapy

## 2022-12-30 DIAGNOSIS — R279 Unspecified lack of coordination: Secondary | ICD-10-CM | POA: Insufficient documentation

## 2022-12-30 DIAGNOSIS — R1031 Right lower quadrant pain: Secondary | ICD-10-CM | POA: Insufficient documentation

## 2022-12-30 DIAGNOSIS — M6281 Muscle weakness (generalized): Secondary | ICD-10-CM | POA: Insufficient documentation

## 2022-12-30 NOTE — Therapy (Signed)
OUTPATIENT PHYSICAL THERAPY TREATMENT NOTE   Patient Name: April Williams MRN: [id number] DOB:[birth year], 22 y.o., female 20 Date: 12/30/2022  PCP: Tollie Eth, NP  REFERRING PROVIDER:  Federico Flake, MD   END OF SESSION:   PT End of Session - 12/30/22 0936     Visit Number 10    Date for PT Re-Evaluation 02/21/23    Authorization Type [facility]    PT Start Time 0935    PT Stop Time 1015    PT Time Calculation (min) 40 min    Activity Tolerance Patient tolerated treatment well    Behavior During Therapy [facility] for tasks assessed/performed             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    Irritable bowel syndrome    Nasal polyps    Obsessive-compulsive disorder    Physical deconditioning 04/21/2018   Plantar wart    POTS (postural orthostatic tachycardia syndrome)    Scoliosis of thoracolumbar spine    Vitamin D deficiency    Past Surgical History:  Procedure Laterality Date   NASAL SINUS SURGERY  06/12/2017   wisdom tooth removal     Patient Active Problem List   Diagnosis Date Noted   Family history of clotting disorder 10/28/2022   Iron deficiency 10/28/2022   Encounter for medical examination to establish care 03/21/2022   Ectopic atrial rhythm 06/13/2020   Irritable bowel syndrome    Obsessive compulsive disorder 06/15/2019   Persistent depressive disorder with melancholic features, currently moderate 06/15/2019   Avoidant-restrictive food intake disorder (ARFID) 06/15/2019   Adjustment disorder with mixed anxiety and depressed mood 07/28/2018   Lump of right breast 06/30/2018   Dysautonomia (HCC) 09/02/2017   Adolescent idiopathic scoliosis of thoracolumbar region 08/04/2017    Delayed sleep phase syndrome 08/01/2017   POTS (postural orthostatic tachycardia syndrome) 06/30/2017   Chronic daily headache 06/30/2017   Chronic pansinusitis 05/27/2017   REFERRING DIAG: K59.04 (ICD-10-CM) - Chronic idiopathic constipation    THERAPY DIAG:  Muscle weakness (generalized)   Unspecified lack of coordination   Right lower quadrant abdominal pain   Rationale for Evaluation and Treatment: Rehabilitation   ONSET DATE: 2021   SUBJECTIVE:  SUBJECTIVE STATEMENT: I am doing well. I continue to have diarrhea. I am emptying my bladder now.    Marland Kitchen    PAIN:  Are you having pain? Yes NPRS scale: 0/10, 12/30/22 Pain location:  right lower abdominal   Pain type: aching and dull Pain description: intermittent    Aggravating factors: certain foods, eating, sitting on commode with feet on squatty potty increases rectal pain Relieving factors: occasional enemas  PAIN:  Are you having pain? Yes:12/30/22 NPRS scale: 0/10 Pain location: rectum Pain description: sharp and goes posteriorly and goes to the tailbone Aggravating factors: passing stool Relieving factors: relax her muscles      PRECAUTIONS: None   WEIGHT BEARING RESTRICTIONS: No   FALLS:  Has patient fallen in last 6 months? No   LIVING ENVIRONMENT: Lives with: lives with their family     OCCUPATION: student   PLOF: Independent   PATIENT GOALS: reduce pain    PERTINENT HISTORY:  Dysautonomia; IBS; POTS; Scoliosis of Thoracolumbar spine; OCD   BOWEL MOVEMENT: Pain with bowel movement: Yes Type of bowel movement:Type (Bristol Stool Scale) Type 3, 6, 7, Frequency weekly, Strain Yes, and Splinting no Fully empty rectum: Yes Leakage: No Fiber supplement: stool softener and dulcolax, senna - sees GI in [place]     URINATION: Pain with urination: No Fully empty bladder: Yes:   Stream: Strong Urgency: No Frequency: average Leakage:  none     INTERCOURSE:no active   PREGNANCY: no pregnancies       PROLAPSE: None     OBJECTIVE:    DIAGNOSTIC FINDINGS:  Pelvic floor strength II/V  PVR of 144 ml was obtained by bladder scan  No evidence of urethral prolapse Hymenal ring is slightly more prominent      COGNITION: Overall cognitive status: Within functional limits for tasks assessed                          SENSATION: Light touch: Appears intact Proprioception: Appears intact     POSTURE:  scoliosis in thoracolumbar area     LUMBARAROM/PROM: full lumbar ROM     LOWER EXTREMITY ROM: Full bilateral hip ROM     LOWER EXTREMITY MMT:   MMT Right eval Left eval Right/left  08/02/22 Right/left 11/18/22  Hip abduction 4/5 4/5 4/5 4/5    PALPATION:   General  decreased mobility of the lower rib cage, Tightness of diaphragm, tenderness located on the right lower quadrant, lower abdominal tissue has fascial restrictions.                  External Perineal Exam tenderness along the coccygeus, external anal sphincter                             Internal Pelvic Floor tenderness located on the right obturator internist, right iliococcygeus, external and internal anal sphincter; her pelvic floor muscles relax when she brings her knees to chest. Not able to push therapist finger out of the anal canal.    Patient confirms identification and approves PT to assess internal pelvic floor and treatment Yes   PELVIC MMT:   MMT eval   Internal Anal Sphincter 2/5   External Anal Sphincter 2/5   Puborectalis 2/5   (Blank rows = not tested)         TONE: increased     TODAY'S TREATMENT:  12/30/22 Manual: Soft tissue mobilization: To assess for  dry needling Manual work to the left superior transverse and external anal sphincter and levator ani. Trigger Point Dry-Needling  Treatment  instructions: Expect mild to moderate muscle soreness. S/S of pneumothorax if dry needled over a lung field, and to seek immediate medical attention should they occur. Patient verbalized understanding of these instructions and education.  Patient Consent Given: Yes Education handout provided: Yes Muscles treated: left external anal sphincter and left superior transverse  Electrical stimulation performed: No Parameters: N/A Treatment response/outcome: elongation of muscle and trigger point response Exercises: Stretches/mobility: Diaphragmatic breathing to expand the anal region for defecation Strengthening: Bridge with green band 15 x  Supine march with green band 20 x Bridge with ball squeeze 15 x Quadruped hip abduction with yellow band 10 X Quadruped lift upper extremity with core engaged 10 x each Quadruped lift lower extremity with core engaged 10 x each    12/23/22 Manual: Myofascial release: Fascial release around the anus to work on the trigger points.  Internal pelvic floor techniques: No emotional/communication barriers or cognitive limitation. Patient is motivated to learn. Patient understands and agrees with treatment goals and plan. PT explains patient will be examined in standing, sitting, and lying down to see how their muscles and joints work. When they are ready, they will be asked to remove their underwear so PT can examine their perineum. The patient is also given the option of providing their own chaperone as one is not provided in our facility. The patient also has the right and is explained the right to defer or refuse any part of the evaluation or treatment including the internal exam. With the patient's consent, PT will use one gloved finger to gently assess the muscles of the pelvic floor, seeing how well it contracts and relaxes and if there is muscle symmetry. After, the patient will get dressed and PT and patient will discuss exam findings and plan of care. PT and  patient discuss plan of care, schedule, attendance policy and HEP activities.  Go through the rectum performing manual work to the internal and external anal sphincter Manual work on the outside of the rectum working on the tissue to [employer] Dry-Needling  Treatment instructions: Expect mild to moderate muscle soreness. S/S of pneumothorax if dry needled over a lung field, and to seek immediate medical attention should they occur. Patient verbalized understanding of these instructions and education.  Patient Consent Given: Yes Education handout provided: Yes Muscles treated: external anal sphincter Electrical stimulation performed: No Parameters: N/A Treatment response/outcome: elongation of muscle and trigger point response Neuromuscular re-education: Pelvic floor contraction training:  Diaphragmatic breathing to open the anus with therapist finger at the anus Breathing into the hand to increase pressure to push stool out without increasing contraction of the anus.  Exercises: Strengthening: Transverse abdominus contraction 10 times Supine marching with lower abdominal contraction going slowly and controlled Standing Pallof with green band and tactile cues to assist with lower abdomen contraction Plank with hands on the wall and shoulder flexion keeping the lower abdominal  contraction   11/29/22 Manual: Soft tissue mobilization: To assess for dry needling Myofascial release: Fascial release along the perineal body, superior transverse perineum,  Trigger Point Dry-Needling  Treatment instructions: Expect mild to moderate muscle soreness. S/S of pneumothorax if dry needled over a lung field, and to seek immediate medical attention should they occur. Patient verbalized understanding of these instructions and education.  Patient Consent Given: Yes Education handout provided: Yes Muscles treated: puborectalis, coccygeus on  the right, perineal body, superior  transverse Electrical stimulation performed: No Parameters: N/A Treatment response/outcome: elongation of muscle and trigger point response Neuromuscular re-education: Down training: Diaphragmatic breathing with opening up the anus to facilitate her having a bowel movement laying on her right side Exercises: Strengthening: Supine march with pressing hands into the mat, engaging her abdominals, going slowly 10 x each leg Supine march pressing into the medial knee to engage the obliques 10 x each Quadruped lift arm 10 x each Quadruped slide foot outward without letting hip drop 5 x each       PATIENT EDUCATION:  12/30/22 Education details: Access Code: [email or web address];information on dry needling Person educated: Patient Education method: Explanation, Demonstration, Tactile cues, Verbal cues, Handouts, and you tube video Education comprehension: verbalized understanding, returned demonstration, verbal cues required, tactile cues required, and needs further education   HOME EXERCISE PROGRAM: 12/30/22 Access Code: [email or web address] URL: [email or web address] Date: 12/30/2022 Prepared by: Eulis Foster  Program Notes sit on ball and massage pelvic floor  Exercises - Seated Diaphragmatic Breathing  - 2 x daily - 7 x weekly - 1 sets - 10 reps - Sidelying Thoracic Rotation with Open Book  - 1 x daily - 7 x weekly - 3 sets - 10 reps - Thoracic Sidebending with Towel Roll  - 1 x daily - 7 x weekly - 1 sets - 10 reps - Supine Bridge with Mini Swiss Ball Between Knees  - 1 x daily - 3 x weekly - 1 sets - 10 reps - Quadruped Exhale with Pelvic Floor Contraction and Arm Raise  - 1 x daily - 3 x weekly - 1 sets - 10 reps - Quadruped Exhale with Pelvic Floor Contraction and Leg Extension  - 1 x daily - 3 x weekly - 1 sets - 5 reps - Bridge with Hip Abduction and Resistance  - 1 x daily - 3 x weekly - 2 sets - 10 reps - Supine March with Resistance Band  - 1 x daily - 3 x weekly - 2 sets - 10 reps -  Quadruped Hip Abduction with Resistance Loop  - 1 x daily - 3 x weekly - 1 sets - 10 reps   ASSESSMENT:   CLINICAL IMPRESSION: Patient is a 22 y.o. female who was seen today for physical therapy  treatment for constipation. She is straining less. Patient is not having rectal and abdominal pain. She is able to engage the lower abdominals with exercise better and improved control. She was able to have her exercises progressed due to getting stronger.  Patient will benefit from skilled therapy to improve coordination and relaxation of her muscles to reduce pain and constipation.    OBJECTIVE IMPAIRMENTS: decreased activity tolerance, decreased endurance, decreased strength, increased fascial restrictions, increased muscle spasms, and pain.    ACTIVITY LIMITATIONS: toileting   PARTICIPATION LIMITATIONS: community activity   PERSONAL FACTORS: Age, Time since onset of injury/illness/exacerbation, and 3+ comorbidities: Dysautonomia; IBS; POTS; Scoliosis of Thoracolumbar spine; OCD  are also affecting patient's functional outcome.    REHAB POTENTIAL: Excellent   CLINICAL DECISION MAKING: Evolving/moderate complexity   EVALUATION COMPLEXITY: Moderate     GOALS: Goals reviewed with patient? Yes   SHORT TERM GOALS: Target date: 07/11/22   Patient is able to demonstrate correct toileting technique to have a bowel movement.  Baseline: still working on the breath work Goal status: ongoing 08/02/22   2.  Patient understands how to perform diaphragmatic breathing with expansion of her rib cage to relax  her pelvic floor.  Baseline:  Goal status: Met 08/02/22   3.  Patient understands how to perform abdominal massage to assist with peristalic motion of the intestines.  Baseline:  Goal status: Met 08/02/22   4.  Patient reports her pain reduced >/= 25% due to reduction of trigger points and increased in muscle relaxation.  Baseline: decreased by 20% Goal status: Met 09/23/22    LONG TERM GOALS:  Target date: 02/21/23   Patient is independent with advanced HEP to improve pelvic floor coordination for a bowel movement.  Baseline:  Goal status: ongoing 11/18/22   2.  Patient is able to contract the lower abdominals equally with the upper abdominals so she is able to generate correct force to push stool out.  Baseline:  Goal status: ongoing 11/18/22   3.  Patient pelvic floor muscles are relaxed  and reduced trigger points so her pain decreased >/= 75%.  Baseline:  Goal status: ongoing 11/18/22   4.  Patient is able to have a bowel movement 2 times per week due to improve intestinal mobility and relaxation of the pelvic floor.  Baseline: every other day with diarrhea, 1-2 times per week with solid stool Goal status: ongoing 12/23/22   5.  Patient is able to push the therapist finger out of the rectum due to improve coordination of her pelvic floor.  Baseline:  Goal status: ongoing 11/18/22       PLAN:   PT FREQUENCY: 1x/week   PT DURATION: 12 weeks   PLANNED INTERVENTIONS: Therapeutic exercises, Therapeutic activity, Neuromuscular re-education, Patient/Family education, Joint mobilization, Dry Needling, Electrical stimulation, Spinal mobilization, Cryotherapy, Moist heat, Taping, Ultrasound, Biofeedback, and Manual therapy   PLAN FOR NEXT SESSION:  dry needling  to right pelvic floor, work on core and back strength,  work on pushing the therapist finger out of the rectum; RUSI   Eulis Foster, PT 12/30/22 10:14 AM

## 2023-01-27 ENCOUNTER — Ambulatory Visit: Payer: 59 | Attending: Family Medicine | Admitting: Physical Therapy

## 2023-01-27 ENCOUNTER — Encounter: Payer: Self-pay | Admitting: Physical Therapy

## 2023-01-27 DIAGNOSIS — M6281 Muscle weakness (generalized): Secondary | ICD-10-CM | POA: Diagnosis not present

## 2023-01-27 DIAGNOSIS — R279 Unspecified lack of coordination: Secondary | ICD-10-CM | POA: Insufficient documentation

## 2023-01-27 DIAGNOSIS — R1031 Right lower quadrant pain: Secondary | ICD-10-CM | POA: Diagnosis not present

## 2023-01-27 NOTE — Therapy (Signed)
OUTPATIENT PHYSICAL THERAPY TREATMENT NOTE   Patient Name: April Williams MRN: [id number] DOB:05-10-2000, 22 y.o., female 7 Date: 01/27/2023  PCP: Tollie Eth, NP  REFERRING PROVIDER:  Federico Flake, MD   END OF SESSION:   PT End of Session - 01/27/23 1444     Visit Number 11    Date for PT Re-Evaluation 02/21/23    Authorization Type [facility]    PT Start Time 1445    PT Stop Time 1525    PT Time Calculation (min) 40 min    Activity Tolerance Patient tolerated treatment well    Behavior During Therapy [facility] for tasks assessed/performed             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    Irritable bowel syndrome    Nasal polyps    Obsessive-compulsive disorder    Physical deconditioning 04/21/2018   Plantar wart    POTS (postural orthostatic tachycardia syndrome)    Scoliosis of thoracolumbar spine    Vitamin D deficiency    Past Surgical History:  Procedure Laterality Date   NASAL SINUS SURGERY  06/12/2017   wisdom tooth removal     Patient Active Problem List   Diagnosis Date Noted   Family history of clotting disorder 10/28/2022   Iron deficiency 10/28/2022   Encounter for medical examination to establish care 03/21/2022   Ectopic atrial rhythm 06/13/2020   Irritable bowel syndrome    Obsessive compulsive disorder 06/15/2019   Persistent depressive disorder with melancholic features, currently moderate 06/15/2019   Avoidant-restrictive food intake disorder (ARFID) 06/15/2019   Adjustment disorder with mixed anxiety and depressed mood 07/28/2018   Lump of right breast 06/30/2018   Dysautonomia (HCC) 09/02/2017   Adolescent idiopathic scoliosis of thoracolumbar region 08/04/2017    Delayed sleep phase syndrome 08/01/2017   POTS (postural orthostatic tachycardia syndrome) 06/30/2017   Chronic daily headache 06/30/2017   Chronic pansinusitis 05/27/2017   REFERRING DIAG: K59.04 (ICD-10-CM) - Chronic idiopathic constipation    THERAPY DIAG:  Muscle weakness (generalized)   Unspecified lack of coordination   Right lower quadrant abdominal pain   Rationale for Evaluation and Treatment: Rehabilitation   ONSET DATE: 2021   SUBJECTIVE:  SUBJECTIVE STATEMENT: I been having more regular bowel movements. Patient is having a bowel movement every couple of days. I am not getting as much right lower quadrant pain is 70% better.  I am straining 20% less of the time. Pain with bowel movements is 30% better. She will get a sharp pain in rectum every often during pushing with a sharp pain going upward, when she is straining. No tailbone pain.     PAIN:  Are you having pain? Yes NPRS scale: 0/10, 12/30/22 Pain location:  right lower abdominal   Pain type: aching and dull Pain description: intermittent    Aggravating factors: certain foods, eating, sitting on commode with feet on squatty potty increases rectal pain Relieving factors: occasional enemas  PAIN:  Are you having pain? Yes:12/30/22 NPRS scale: 0/10 Pain location: rectum Pain description: sharp and goes posteriorly and goes to the tailbone Aggravating factors: passing stool Relieving factors: relax her muscles      PRECAUTIONS: None   WEIGHT BEARING RESTRICTIONS: No   FALLS:  Has patient fallen in last 6 months? No   LIVING ENVIRONMENT: Lives with: lives with their family     OCCUPATION: student   PLOF: Independent   PATIENT GOALS: reduce pain    PERTINENT HISTORY:  Dysautonomia; IBS; POTS; Scoliosis of  Thoracolumbar spine; OCD   BOWEL MOVEMENT: Pain with bowel movement: Yes Type of bowel movement:Type (Bristol Stool Scale) Type 3, 6, 7, Frequency weekly, Strain Yes, and Splinting no Fully empty rectum: Yes Leakage: No Fiber supplement: stool softener and dulcolax, senna - sees GI in [place]    URINATION: Pain with urination: No Fully empty bladder: Yes:   Stream: Strong Urgency: No Frequency: average Leakage:  none     INTERCOURSE:no active   PREGNANCY: no pregnancies       PROLAPSE: None     OBJECTIVE:    DIAGNOSTIC FINDINGS:  Pelvic floor strength II/V  PVR of 144 ml was obtained by bladder scan  No evidence of urethral prolapse Hymenal ring is slightly more prominent      COGNITION: Overall cognitive status: Within functional limits for tasks assessed                          SENSATION: Light touch: Appears intact Proprioception: Appears intact     POSTURE:  scoliosis in thoracolumbar area     LUMBARAROM/PROM: full lumbar ROM     LOWER EXTREMITY ROM: Full bilateral hip ROM     LOWER EXTREMITY MMT:   MMT Right eval Left eval Right/left  08/02/22 Right/left 11/18/22  Hip abduction 4/5 4/5 4/5 4/5    PALPATION:   General  decreased mobility of the lower rib cage, Tightness of diaphragm, tenderness located on the right lower quadrant, lower abdominal tissue has fascial restrictions.                  External Perineal Exam tenderness along the coccygeus, external anal sphincter                             Internal Pelvic Floor tenderness located on the right obturator internist, right iliococcygeus, external and internal anal sphincter; her pelvic floor muscles relax when she brings her knees to chest. Not able to push therapist finger out of the anal canal.    Patient confirms identification and approves PT to assess internal pelvic floor and treatment Yes   PELVIC  MMT:   MMT eval   Internal Anal Sphincter 2/5   External Anal Sphincter 2/5    Puborectalis 2/5   (Blank rows = not tested)         TONE: increased     TODAY'S TREATMENT:  01/27/23 Manual: Soft tissue mobilization:  To assess for dry needling Manual work to the external anal sphincter externally to elongate the tissue after dry needling.  Trigger Point Dry-Needling  Treatment instructions: Expect mild to moderate muscle soreness. S/S of pneumothorax if dry needled over a lung field, and to seek immediate medical attention should they occur. Patient verbalized understanding of these instructions and education.  Patient Consent Given: Yes Education handout provided: Yes Muscles treated: right external anal sphincter   Electrical stimulation performed: No Parameters: N/A Treatment response/outcome: elongation of muscle and trigger point response Exercises: Stretches/mobility: Laying on side with therapist finger on the outside of the anus and patient will blow out into her fist to generate pressure to push stool out Strengthening: Sitting push on ball that is on her lap as she blows out.  Sitting with diagonally lifting the ball with breath and breath out when comes down to engage the abdomen and work on breath Sitting holding ball at shoulder height moving side to side Standing pushing the ball into the wall to engage her abdominals   12/30/22 Manual: Soft tissue mobilization: To assess for dry needling Manual work to the left superior transverse and external anal sphincter and levator ani. Trigger Point Dry-Needling  Treatment instructions: Expect mild to moderate muscle soreness. S/S of pneumothorax if dry needled over a lung field, and to seek immediate medical attention should they occur. Patient verbalized understanding of these instructions and education.  Patient Consent Given: Yes Education handout provided: Yes Muscles treated: left external anal sphincter and left superior transverse  Electrical stimulation performed: No Parameters:  N/A Treatment response/outcome: elongation of muscle and trigger point response Exercises: Stretches/mobility: Diaphragmatic breathing to expand the anal region for defecation Strengthening: Bridge with green band 15 x  Supine march with green band 20 x Bridge with ball squeeze 15 x Quadruped hip abduction with yellow band 10 X Quadruped lift upper extremity with core engaged 10 x each Quadruped lift lower extremity with core engaged 10 x each    12/23/22 Manual: Myofascial release: Fascial release around the anus to work on the trigger points.  Internal pelvic floor techniques: No emotional/communication barriers or cognitive limitation. Patient is motivated to learn. Patient understands and agrees with treatment goals and plan. PT explains patient will be examined in standing, sitting, and lying down to see how their muscles and joints work. When they are ready, they will be asked to remove their underwear so PT can examine their perineum. The patient is also given the option of providing their own chaperone as one is not provided in our facility. The patient also has the right and is explained the right to defer or refuse any part of the evaluation or treatment including the internal exam. With the patient's consent, PT will use one gloved finger to gently assess the muscles of the pelvic floor, seeing how well it contracts and relaxes and if there is muscle symmetry. After, the patient will get dressed and PT and patient will discuss exam findings and plan of care. PT and patient discuss plan of care, schedule, attendance policy and HEP activities.  Go through the rectum performing manual work to the internal and external anal sphincter Manual work on  the outside of the rectum working on the tissue to elongate  Trigger Point Dry-Needling  Treatment instructions: Expect mild to moderate muscle soreness. S/S of pneumothorax if dry needled over a lung field, and to seek immediate medical  attention should they occur. Patient verbalized understanding of these instructions and education.  Patient Consent Given: Yes Education handout provided: Yes Muscles treated: external anal sphincter Electrical stimulation performed: No Parameters: N/A Treatment response/outcome: elongation of muscle and trigger point response Neuromuscular re-education: Pelvic floor contraction training:  Diaphragmatic breathing to open the anus with therapist finger at the anus Breathing into the hand to increase pressure to push stool out without increasing contraction of the anus.  Exercises: Strengthening: Transverse abdominus contraction 10 times Supine marching with lower abdominal contraction going slowly and controlled Standing Pallof with green band and tactile cues to assist with lower abdomen contraction Plank with hands on the wall and shoulder flexion keeping the lower abdominal  contraction        PATIENT EDUCATION:  01/27/23 Education details: Access Code: [email or web address];information on dry needling Person educated: Patient Education method: Explanation, Demonstration, Tactile cues, Verbal cues, Handouts, and you tube video Education comprehension: verbalized understanding, returned demonstration, verbal cues required, tactile cues required, and needs further education   HOME EXERCISE PROGRAM: 01/27/23 Access Code: [email or web address] URL: [email or web address] Date: 01/27/2023 Prepared by: Eulis Foster  Program Notes sit on ball and massage pelvic floor  Exercises - - Seated Diagonal Chop with Medicine Ball  - 1 x daily - 3 x weekly - 2 sets - 10 reps - Seated Trunk Rotation with Anchored Resistance  - 1 x daily - 3 x weekly - 2 sets - 10 reps - Seated Abdominal Press into Swiss Ball  - 1 x daily - 3 x weekly - 1 sets - 10 reps - 5 sec hold - Wall Push Up  - 1 x daily - 3 x weekly - 1 sets - 10 reps   ASSESSMENT:   CLINICAL IMPRESSION: Patient is a 22 y.o. female who was seen  today for physical therapy  treatment for constipation. She is straining  20% less. She is having bowel movements every couple of days. She is having 30% less pain with bowel movements. Patient is not having rectal and abdominal pain. She is able to engage the lower abdominals with exercise better and improved control. She was able to have her exercises progressed due to getting stronger. Right quadrant pain is 70% better. Patient is working on abdominal strength to assist with bowel movements.  Patient will benefit from skilled therapy to improve coordination and relaxation of her muscles to reduce pain and constipation.    OBJECTIVE IMPAIRMENTS: decreased activity tolerance, decreased endurance, decreased strength, increased fascial restrictions, increased muscle spasms, and pain.    ACTIVITY LIMITATIONS: toileting   PARTICIPATION LIMITATIONS: community activity   PERSONAL FACTORS: Age, Time since onset of injury/illness/exacerbation, and 3+ comorbidities: Dysautonomia; IBS; POTS; Scoliosis of Thoracolumbar spine; OCD  are also affecting patient's functional outcome.    REHAB POTENTIAL: Excellent   CLINICAL DECISION MAKING: Evolving/moderate complexity   EVALUATION COMPLEXITY: Moderate     GOALS: Goals reviewed with patient? Yes   SHORT TERM GOALS: Target date: 07/11/22   Patient is able to demonstrate correct toileting technique to have a bowel movement.  Baseline: still working on the breath work Goal status: ongoing 08/02/22   2.  Patient understands how to perform diaphragmatic breathing with expansion of her rib cage to relax her pelvic floor.  Baseline:  Goal status: Met 08/02/22   3.  Patient understands how to perform abdominal massage to assist with peristalic motion of the intestines.  Baseline:  Goal status: Met 08/02/22   4.  Patient reports her pain reduced >/= 25% due to reduction of trigger points and increased in muscle relaxation.  Baseline: decreased by 20% Goal  status: Met 09/23/22    LONG TERM GOALS: Target date: 02/21/23   Patient is independent with advanced HEP to improve pelvic floor coordination for a bowel movement.  Baseline:  Goal status: ongoing 11/18/22   2.  Patient is able to contract the lower abdominals equally with the upper abdominals so she is able to generate correct force to push stool out.  Baseline:  Goal status: ongoing 11/18/22   3.  Patient pelvic floor muscles are relaxed  and reduced trigger points so her pain decreased >/= 75%.  Baseline:  Goal status: ongoing 11/18/22   4.  Patient is able to have a bowel movement 2 times per week due to improve intestinal mobility and relaxation of the pelvic floor.  Baseline: every other day with diarrhea, 1-2 times per week with solid stool Goal status: ongoing 12/23/22   5.  Patient is able to push the therapist finger out of the rectum due to improve coordination of her pelvic floor.  Baseline:  Goal status: ongoing 11/18/22       PLAN:   PT FREQUENCY: 1x/week   PT DURATION: 12 weeks   PLANNED INTERVENTIONS: Therapeutic exercises, Therapeutic activity, Neuromuscular re-education, Patient/Family education, Joint mobilization, Dry Needling, Electrical stimulation, Spinal mobilization, Cryotherapy, Moist heat, Taping, Ultrasound, Biofeedback, and Manual therapy   PLAN FOR NEXT SESSION:  dry needling  to left pelvic floor, work on core and back strength   Eulis Foster, PT 01/27/23 3:29 PM

## 2023-02-03 ENCOUNTER — Encounter: Payer: Self-pay | Admitting: Nurse Practitioner

## 2023-02-03 ENCOUNTER — Ambulatory Visit: Payer: 59 | Admitting: Nurse Practitioner

## 2023-02-03 ENCOUNTER — Other Ambulatory Visit (HOSPITAL_COMMUNITY): Payer: Self-pay

## 2023-02-03 ENCOUNTER — Other Ambulatory Visit: Payer: Self-pay

## 2023-02-03 VITALS — BP 112/70 | HR 110 | Ht 66.75 in | Wt 165.4 lb

## 2023-02-03 DIAGNOSIS — Z0000 Encounter for general adult medical examination without abnormal findings: Secondary | ICD-10-CM

## 2023-02-03 DIAGNOSIS — Z23 Encounter for immunization: Secondary | ICD-10-CM

## 2023-02-03 DIAGNOSIS — G90A Postural orthostatic tachycardia syndrome (POTS): Secondary | ICD-10-CM | POA: Diagnosis not present

## 2023-02-03 DIAGNOSIS — F341 Dysthymic disorder: Secondary | ICD-10-CM | POA: Diagnosis not present

## 2023-02-03 DIAGNOSIS — G901 Familial dysautonomia [Riley-Day]: Secondary | ICD-10-CM

## 2023-02-03 DIAGNOSIS — F4323 Adjustment disorder with mixed anxiety and depressed mood: Secondary | ICD-10-CM

## 2023-02-03 DIAGNOSIS — Z832 Family history of diseases of the blood and blood-forming organs and certain disorders involving the immune mechanism: Secondary | ICD-10-CM | POA: Diagnosis not present

## 2023-02-03 DIAGNOSIS — N920 Excessive and frequent menstruation with regular cycle: Secondary | ICD-10-CM

## 2023-02-03 NOTE — Patient Instructions (Signed)
 For all adult patients, I recommend A well balanced diet low in saturated fats, cholesterol, and moderation in carbohydrates.   This can be as simple as monitoring portion sizes and cutting back on sugary beverages such as soda and juice to start with.    Daily water consumption of at least 64 ounces.  Physical activity at least 180 minutes per week, if just starting out.   This can be as simple as taking the stairs instead of the elevator and walking 2-3 laps around the office  purposefully every day.   STD protection, partner selection, and regular testing if high risk.  Limited consumption of alcoholic beverages if alcohol is consumed.  For women, I recommend no more than 7 alcoholic beverages per week, spread out throughout the week.  Avoid "binge" drinking or consuming large quantities of alcohol in one setting.   Please let me know if you feel you may need help with reduction or quitting alcohol consumption.   Avoidance of nicotine, if used.  Please let me know if you feel you may need help with reduction or quitting nicotine use.   Daily mental health attention.  This can be in the form of 5 minute daily meditation, prayer, journaling, yoga, reflection, etc.   Purposeful attention to your emotions and mental state can significantly improve your overall wellbeing  and  Health.  Please know that I am here to help you with all of your health care goals and am happy to work with you to find a solution that works best for you.  The greatest advice I have received with any changes in life are to take it one step at a time, that even means if all you can focus on is the next 60 seconds, then do that and celebrate your victories.  With any changes in life, you will have set backs, and that is OK. The important thing to remember is, if you have a set back, it is not a failure, it is an opportunity to try again!  Health Maintenance Recommendations Screening Testing Mammogram Every 1 -2  years based on history and risk factors Starting at age 40 Pap Smear Ages 21-39 every 3 years Ages 30-65 every 5 years with HPV testing More frequent testing may be required based on results and history Colon Cancer Screening Every 1-10 years based on test performed, risk factors, and history Starting at age 45 Bone Density Screening Every 2-10 years based on history Starting at age 65 for women Recommendations for men differ based on medication usage, history, and risk factors AAA Screening One time ultrasound Men 65-75 years old who have every smoked Lung Cancer Screening Low Dose Lung CT every 12 months Age 55-80 years with a 30 pack-year smoking history who still smoke or who have quit within the last 15 years  Screening Labs Routine  Labs: Complete Blood Count (CBC), Complete Metabolic Panel (CMP), Cholesterol (Lipid Panel) Every 6-12 months based on history and medications May be recommended more frequently based on current conditions or previous results Hemoglobin A1c Lab Every 3-12 months based on history and previous results Starting at age 45 or earlier with diagnosis of diabetes, high cholesterol, BMI >26, and/or risk factors Frequent monitoring for patients with diabetes to ensure blood sugar control Thyroid Panel (TSH w/ T3 & T4) Every 6 months based on history, symptoms, and risk factors May be repeated more often if on medication HIV One time testing for all patients 13 and older May be   repeated more frequently for patients with increased risk factors or exposure Hepatitis C One time testing for all patients 18 and older May be repeated more frequently for patients with increased risk factors or exposure Gonorrhea, Chlamydia Every 12 months for all sexually active persons 13-24 years Additional monitoring may be recommended for those who are considered high risk or who have symptoms PSA Men 40-54 years old with risk factors Additional screening may be  recommended from age 55-69 based on risk factors, symptoms, and history  Vaccine Recommendations Tetanus Booster All adults every 10 years Flu Vaccine All patients 6 months and older every year COVID Vaccine All patients 12 years and older Initial dosing with booster May recommend additional booster based on age and health history HPV Vaccine 2 doses all patients age 9-26 Dosing may be considered for patients over 26 Shingles Vaccine (Shingrix) 2 doses all adults 55 years and older Pneumonia (Pneumovax 23) All adults 65 years and older May recommend earlier dosing based on health history Pneumonia (Prevnar 13) All adults 65 years and older Dosed 1 year after Pneumovax 23  Additional Screening, Testing, and Vaccinations may be recommended on an individualized basis based on family history, health history, risk factors, and/or exposure.   

## 2023-02-03 NOTE — Progress Notes (Signed)
Shawna Clamp, DNP, AGNP-c [facility] [address] [place], [place] [address] Main Office [phone]  BP 112/70   Pulse (!) 110   Ht 5' 6.75" (1.695 m)   Wt 165 lb 6.4 oz (75 kg)   LMP  (LMP Unknown) Comment: may be a week or two ago  BMI 26.10 kg/m    Subjective:    Patient ID: April Williams, female    DOB: 2000-08-22, 22 y.o.   MRN: [id number]  HPI: April Williams is a 22 y.o. female presenting on 02/03/2023 for comprehensive medical examination.   History of Present Illness The patient, with a history of POTS and heavy menstrual periods, reports no recent flare-ups of POTS and daily headaches that are no longer bothersome. She also mentions a family history of clotting disorders, which has raised concerns but no testing has been pursued yet. The patient's menstrual periods are described as heavy and random, with significant clotting. She has been managing the pain with Naproxen, taken around the clock during her period. She declines the offer for additional testing at this time.   The patient also reports a stable mood and has not been seeing a counselor recently, as she feels she is managing well. She does not feel that additional management is needed at this time.    She has a history of seeing a cardiologist for her POTS, with the most recent visit being one to two months ago. She is currently on 10mg  of Midodrine three times a day.  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    She has not been experiencing any symptoms that would warrant additional lab work, as her most recent labs were done three months ago and were satisfactory.  Pertinent items are noted in HPI.  IMMUNIZATIONS:   Flu Vaccine: Flu vaccine given today Prevnar 13: Prevnar 13 N/A for this patient Prevnar 20: Prevnar 20 N/A for this patient Pneumovax 23: Pneumovax 23 N/A for this patient Vac Shingrix: Shingrix N/A for this  patient HPV: HPV contraindicated Tetanus: Tetanus due, given today  COVID: COVID completed, documentation in chart  RSV: No  HEALTH MAINTENANCE: Pap Smear HM Status: N/A Mammogram HM Status: N/A Colon Cancer Screening HM Status: N/A Bone Density HM Status: N/A STI Testing HM Status: was declined  Lung CT HM Status: N/A  Concerns with vision, hearing, or dentition: No  Most Recent Depression Screen:     02/03/2023    8:49 AM 09/20/2021    4:00 PM 04/07/2018    4:24 PM 09/03/2017   11:08 AM 08/01/2017   10:56 AM  Depression screen PHQ 2/9  Decreased Interest 1 1 0 0 0  Down, Depressed, Hopeless 1 2 2  0 1  PHQ - 2 Score 2 3 2  0 1  Altered sleeping 1 0 0 1 3  Tired, decreased energy 0 2 2 3 3   Change in appetite 0 1 0 0 0  Feeling bad or failure about yourself   1 1 0 0  Trouble concentrating 0 0 2 3 3   Moving slowly or fidgety/restless 0 0 0 2 1  Suicidal thoughts 0 1     PHQ-9 Score 3 8 7 9 11   Difficult doing work/chores Not difficult at all       Most Recent Anxiety Screen:     09/20/2021    4:00 PM 04/07/2018    4:24 PM 09/03/2017   11:08 AM 08/01/2017  10:56 AM  GAD 7 : Generalized Anxiety Score  Nervous, Anxious, on Edge 1 2 1 1   Control/stop worrying 1 2 0 1  Worry too much - different things 1 1 0 2  Trouble relaxing 0 0 0 0  Restless 0 0 0 1  Easily annoyed or irritable 1 0 0 0  Afraid - awful might happen 0 0 0 0  Total GAD 7 Score 4 5 1 5    Most Recent Fall Screen:    02/03/2023    8:49 AM 01/30/2022    8:31 AM  Fall Risk   Falls in the past year? 0 0  Number falls in past yr: 0 0  Injury with Fall? 0 0  Risk for fall due to : No Fall Risks No Fall Risks  Follow up Falls evaluation completed Falls evaluation completed;Education provided    Past medical history, surgical history, medications, allergies, family history and social history reviewed with patient today and changes made to appropriate areas of the chart.  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   ferrous sulfate 324 (65 Fe) MG TBEC Take 1 tablet by mouth in the morning and 1 tablet in the evening with meals. If causes constipation, begin miralax daily as needed. Check Hgb/ferritin in 3 months..   fludrocortisone (FLORINEF) 0.1 MG tablet Take 0.2 mg by mouth daily.   Magnesium Hydroxide 1200 MG CHEW Chew 1,200 mg by mouth daily.   Melatonin 10 MG TABS Take by mouth.   midodrine (PROAMATINE) 5 MG tablet Take 2 tablets (10 mg total) by mouth 3 (three) times daily.   norethindrone (MICRONOR) 0.35 MG tablet Take 1 tablet (0.35 mg total) by mouth daily.   senna (SENOKOT) 8.6 MG tablet Take 2 tablets by mouth daily.   metoprolol tartrate (LOPRESSOR) 25 MG tablet TAKE 1 TABLET BY MOUTH ONCE A DAY   No current facility-administered medications on file prior to visit.   Surgical History:  Past Surgical History:  Procedure Laterality Date   NASAL SINUS SURGERY  06/12/2017   wisdom tooth removal     Allergies:  No Known Allergies Family History:  Family History  Problem Relation Age of Onset   Depression Mother    OCD  Mother    Obesity Mother    Asthma Brother    Stroke Maternal Uncle    Depression Maternal Grandmother    Diabetes Paternal Grandmother    Seizures Other    Migraines Neg Hx    Anxiety disorder Neg Hx    Bipolar disorder Neg Hx  Schizophrenia Neg Hx    ADD / ADHD Neg Hx    Autism Neg Hx        Objective:    BP 112/70   Pulse (!) 110   Ht 5' 6.75" (1.695 m)   Wt 165 lb 6.4 oz (75 kg)   LMP  (LMP Unknown) Comment: may be a week or two ago  BMI 26.10 kg/m   Wt Readings from Last 3 Encounters:  02/03/23 165 lb 6.4 oz (75 kg)  10/28/22 159 lb 12.8 oz (72.5 kg) (87%, Z= 1.13)*  10/03/22 157 lb 1.6 oz (71.3 kg) (85%, Z= 1.06)*   * Growth percentiles are based on CDC (Girls, 2-20 Years) data.    Physical Exam Vitals and nursing note reviewed.  Constitutional:      General: She is not in acute distress.    Appearance: Normal appearance. She is normal weight.  HENT:     Head: Normocephalic.     Right Ear: Tympanic membrane normal.     Left Ear: Tympanic membrane normal.     Nose: Nose normal.     Mouth/Throat:     Mouth: Mucous membranes are moist.     Pharynx: Oropharynx is clear.  Eyes:     Conjunctiva/sclera: Conjunctivae normal.     Pupils: Pupils are equal, round, and reactive to light.  Neck:     Vascular: No carotid bruit.  Cardiovascular:     Rate and Rhythm: Normal rate.     Pulses: Normal pulses.     Heart sounds: Normal heart sounds.     Comments: Intermittent ectopic beats present.  Pulmonary:     Effort: Pulmonary effort is normal.     Breath sounds: Normal breath sounds.  Abdominal:     General: Bowel sounds are normal. There is no distension.     Palpations: Abdomen is soft.     Tenderness: There is no abdominal tenderness. There is no right CVA tenderness, left CVA tenderness or guarding.  Musculoskeletal:        General: Normal range of motion.     Cervical back: Normal range of motion. No tenderness.     Right lower leg: No edema.     Left  lower leg: No edema.  Lymphadenopathy:     Cervical: No cervical adenopathy.  Skin:    General: Skin is warm and dry.     Capillary Refill: Capillary refill takes less than 2 seconds.  Neurological:     General: No focal deficit present.     Mental Status: She is alert and oriented to person, place, and time.     Motor: No weakness.      Results for orders placed or performed in visit on 10/28/22  Ferritin  Result Value Ref Range   Ferritin 87 11 - 307 ng/mL  CBC with Differential (Cancer Center Only)  Result Value Ref Range   WBC Count 10.6 (H) 4.0 - 10.5 K/uL   RBC 5.52 (H) 3.87 - 5.11 MIL/uL   Hemoglobin 15.6 (H) 12.0 - 15.0 g/dL   HCT 16.1 09.6 - 04.5 %   MCV 81.9 80.0 - 100.0 fL   MCH 28.3 26.0 - 34.0 pg   MCHC 34.5 30.0 - 36.0 g/dL   RDW 40.9 81.1 - 91.4 %   Platelet Count 456 (H) 150 - 400 K/uL   nRBC 0.0 0.0 - 0.2 %   Neutrophils Relative % 75 %   Neutro Abs 7.8 (H) 1.7 - 7.7 K/uL  Lymphocytes Relative 18 %   Lymphs Abs 1.9 0.7 - 4.0 K/uL   Monocytes Relative 6 %   Monocytes Absolute 0.7 0.1 - 1.0 K/uL   Eosinophils Relative 1 %   Eosinophils Absolute 0.1 0.0 - 0.5 K/uL   Basophils Relative 0 %   Basophils Absolute 0.0 0.0 - 0.1 K/uL   Immature Granulocytes 0 %   Abs Immature Granulocytes 0.02 0.00 - 0.07 K/uL       Assessment & Plan:   Problem List Items Addressed This Visit     POTS (postural orthostatic tachycardia syndrome)    No recent flare-ups. Currently on midodrine 10 mg three times daily. Follow-up with cardiologist in 5 months. - Continue midodrine 10 mg three times daily - Follow up with cardiologist in 5 months      Dysautonomia [facility])    Currently managed with cardiology on midodrine 10mg  TID. She follow-up with cardiologist in 5 months. She will likely need to transition to an adult cardiologist in the near future, we will work to help make that transition smooth when the time comes.       Adjustment disorder with mixed anxiety and  depressed mood    Mood reported as stable/consistent. She declines any new or worsening symptoms or need for escalation of care at this time. She is currently not seeing a counselor due to her previous counselor leaving, but declines the offer for referral at this time. Discussed the importance of mental health support and to reach out immediately if she feels her mental health is wavering in any way.        Persistent depressive disorder with melancholic features, currently moderate    See adjustment disorder.       Encounter for annual physical exam - Primary    CPE completed today. Review of HM activities and recommendations discussed and provided on AVS. Anticipatory guidance, diet, and exercise recommendations provided. Medications, allergies, and hx reviewed and updated as necessary. Orders placed as listed below.  Plan: - Labs  reviewed from recent encounter. No changes needed.  . - F/U with CPE in 1 year or sooner for acute/chronic health needs as directed.        Family history of clotting disorder    Family history of clotting disorder with heavy menses and clotting present. At this time she is not interested in further testing. She has been seen by hematology for this in the past and testing was discouraged. Will follow.       Menorrhagia with regular cycle    Reports heavy menstrual bleeding with clots. Naproxen ineffective for reducing bleeding. Ibuprofen 600-800 mg suggested for use the day before and first 2-3 days of period if symptoms allow. - Consider ibuprofen 600-800 mg the day before and first 2-3 days of period if symptoms allow - Do not take naproxen and ibuprofen at the same time, but may use one instead of the other if the symptoms are better controlled with one.       Other Visit Diagnoses     Need for influenza vaccination       Relevant Orders   Flu vaccine trivalent PF, 6mos and older(Flulaval,Afluria,Fluarix,Fluzone) (Completed)   Need for Tdap  vaccination             Follow up plan: Return in about 1 year (around 02/03/2024) for CPE.  NEXT PREVENTATIVE PHYSICAL DUE IN 1 YEAR.  PATIENT COUNSELING PROVIDED FOR ALL ADULT PATIENTS: A well balanced diet low in saturated  fats, cholesterol, and moderation in carbohydrates.  This can be as simple as monitoring portion sizes and cutting back on sugary beverages such as soda and juice to start with.    Daily water consumption of at least 64 ounces.  Physical activity at least 180 minutes per week.  If just starting out, start 10 minutes a day and work your way up.   This can be as simple as taking the stairs instead of the elevator and walking 2-3 laps around the office  purposefully every day.   STD protection, partner selection, and regular testing if high risk.  Limited consumption of alcoholic beverages if alcohol is consumed. For men, I recommend no more than 14 alcoholic beverages per week, spread out throughout the week (max 2 per day). Avoid "binge" drinking or consuming large quantities of alcohol in one setting.  Please let me know if you feel you may need help with reduction or quitting alcohol consumption.   Avoidance of nicotine, if used. Please let me know if you feel you may need help with reduction or quitting nicotine use.   Daily mental health attention. This can be in the form of 5 minute daily meditation, prayer, journaling, yoga, reflection, etc.  Purposeful attention to your emotions and mental state can significantly improve your overall wellbeing  and  Health.  Please know that I am here to help you with all of your health care goals and am happy to work with you to find a solution that works best for you.  The greatest advice I have received with any changes in life are to take it one step at a time, that even means if all you can focus on is the next 60 seconds, then do that and celebrate your victories.  With any changes in life, you will have set  backs, and that is OK. The important thing to remember is, if you have a set back, it is not a failure, it is an opportunity to try again! Screening Testing Mammogram Every 1 -2 years based on history and risk factors Starting at age 42 Pap Smear Ages 21-39 every 3 years Ages 37-65 every 5 years with HPV testing More frequent testing may be required based on results and history Colon Cancer Screening Every 1-10 years based on test performed, risk factors, and history Starting at age 34 Bone Density Screening Every 2-10 years based on history Starting at age 46 for women Recommendations for men differ based on medication usage, history, and risk factors AAA Screening One time ultrasound Men 85-14 years old who have every smoked Lung Cancer Screening Low Dose Lung CT every 12 months Age 77-80 years with a 30 pack-year smoking history who still smoke or who have quit within the last 15 years   Screening Labs Routine  Labs: Complete Blood Count (CBC), Complete Metabolic Panel (CMP), Cholesterol (Lipid Panel) Every 6-12 months based on history and medications May be recommended more frequently based on current conditions or previous results Hemoglobin A1c Lab Every 3-12 months based on history and previous results Starting at age 82 or earlier with diagnosis of diabetes, high cholesterol, BMI >26, and/or risk factors Frequent monitoring for patients with diabetes to ensure blood sugar control Thyroid Panel (TSH) Every 6 months based on history, symptoms, and risk factors May be repeated more often if on medication HIV One time testing for all patients 60 and older May be repeated more frequently for patients with increased risk factors or exposure Hepatitis C  One time testing for all patients 74 and older May be repeated more frequently for patients with increased risk factors or exposure Gonorrhea, Chlamydia Every 12 months for all sexually active persons 13-24 years Additional  monitoring may be recommended for those who are considered high risk or who have symptoms Every 12 months for any woman on birth control, regardless of sexual activity PSA Men 45-78 years old with risk factors Additional screening may be recommended from age 6-69 based on risk factors, symptoms, and history  Vaccine Recommendations Tetanus Booster All adults every 10 years Flu Vaccine All patients 6 months and older every year COVID Vaccine All patients 12 years and older Initial dosing with booster May recommend additional booster based on age and health history HPV Vaccine 2 doses all patients age 98-26 Dosing may be considered for patients over 26 Shingles Vaccine (Shingrix) 2 doses all adults 55 years and older Pneumonia (Pneumovax 42) All adults 65 years and older May recommend earlier dosing based on health history One year apart from Prevnar 22 Pneumonia (Prevnar 15) All adults 65 years and older Dosed 1 year after Pneumovax 23 Pneumonia (Prevnar 20) One time alternative to the two dosing of 13 and 23 For all adults with initial dose of 23, 20 is recommended 1 year later For all adults with initial dose of 13, 23 is still recommended as second option 1 year later

## 2023-02-04 ENCOUNTER — Telehealth: Payer: Self-pay

## 2023-02-04 ENCOUNTER — Other Ambulatory Visit (HOSPITAL_COMMUNITY): Payer: Self-pay

## 2023-02-04 NOTE — Telephone Encounter (Signed)
FMLA form sent back in folder.

## 2023-02-05 ENCOUNTER — Encounter: Payer: Self-pay | Admitting: Psychiatry

## 2023-02-07 ENCOUNTER — Encounter: Payer: Self-pay | Admitting: Physical Therapy

## 2023-02-07 ENCOUNTER — Ambulatory Visit: Payer: 59 | Admitting: Physical Therapy

## 2023-02-07 DIAGNOSIS — R1031 Right lower quadrant pain: Secondary | ICD-10-CM | POA: Diagnosis not present

## 2023-02-07 DIAGNOSIS — R279 Unspecified lack of coordination: Secondary | ICD-10-CM

## 2023-02-07 DIAGNOSIS — M6281 Muscle weakness (generalized): Secondary | ICD-10-CM | POA: Diagnosis not present

## 2023-02-07 NOTE — Therapy (Signed)
OUTPATIENT PHYSICAL THERAPY TREATMENT NOTE   Patient Name: April Williams MRN: [id number] DOB:09/05/2000, 22 y.o., female 76 Date: 02/07/2023  PCP: Tollie Eth, NP  REFERRING PROVIDER:  Federico Flake, MD   END OF SESSION:   PT End of Session - 02/07/23 0806     Visit Number 12    Date for PT Re-Evaluation 02/21/23    Authorization Type [facility]    PT Start Time 0805    PT Stop Time 0845    PT Time Calculation (min) 40 min    Activity Tolerance Patient tolerated treatment well    Behavior During Therapy [facility] for tasks assessed/performed             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    Irritable bowel syndrome    Lump of right breast 06/30/2018   Nasal polyps    Obsessive-compulsive disorder    Physical deconditioning 04/21/2018   Plantar wart    POTS (postural orthostatic tachycardia syndrome)    Scoliosis of thoracolumbar spine    Vitamin D deficiency    Past Surgical History:  Procedure Laterality Date   NASAL SINUS SURGERY  06/12/2017   wisdom tooth removal     Patient Active Problem List   Diagnosis Date Noted   Family history of clotting disorder 10/28/2022   Iron deficiency 10/28/2022   Encounter for annual physical exam 03/21/2022   Ectopic atrial rhythm 06/13/2020   Irritable bowel syndrome    Obsessive compulsive disorder 06/15/2019   Persistent depressive disorder with melancholic features, currently moderate 06/15/2019   Avoidant-restrictive food intake disorder (ARFID) 06/15/2019   Adjustment disorder with mixed anxiety and depressed mood 07/28/2018   Dysautonomia (HCC) 09/02/2017   Adolescent idiopathic scoliosis of thoracolumbar region 08/04/2017   Delayed sleep phase  syndrome 08/01/2017   POTS (postural orthostatic tachycardia syndrome) 06/30/2017   Chronic daily headache 06/30/2017   Chronic pansinusitis 05/27/2017   REFERRING DIAG: K59.04 (ICD-10-CM) - Chronic idiopathic constipation    THERAPY DIAG:  Muscle weakness (generalized)   Unspecified lack of coordination   Right lower quadrant abdominal pain   Rationale for Evaluation and Treatment: Rehabilitation   ONSET DATE: 2021   SUBJECTIVE:  SUBJECTIVE STATEMENT: I have a bowel movement every couple of days and it had been diarrhea. I have not changed my diet. The right lower quadrant pain has not been bothering me much. Rectal pain is 35% better. When I am done with a bowel movement it is yellow. I have had diarrhea for several months.    PAIN:  Are you having pain? Yes NPRS scale: 0/10, 12/30/22 Pain location:  right lower abdominal   Pain type: aching and dull Pain description: intermittent    Aggravating factors: certain foods, eating, sitting on commode with feet on squatty potty increases rectal pain Relieving factors: occasional enemas  PAIN:  Are you having pain? Yes:12/30/22 NPRS scale: 0/10 Pain location: rectum Pain description: sharp and goes posteriorly and goes to the tailbone Aggravating factors: passing stool Relieving factors: relax her muscles      PRECAUTIONS: None   WEIGHT BEARING RESTRICTIONS: No   FALLS:  Has patient fallen in last 6 months? No   LIVING ENVIRONMENT: Lives with: lives with their family     OCCUPATION: student   PLOF: Independent   PATIENT GOALS: reduce pain    PERTINENT HISTORY:  Dysautonomia; IBS; POTS; Scoliosis of Thoracolumbar spine; OCD   BOWEL MOVEMENT: Pain with bowel movement: Yes Type of bowel movement:Type (Bristol Stool Scale) Type 3,  6, 7, Frequency weekly, Strain Yes, and Splinting no Fully empty rectum: Yes Leakage: No Fiber supplement: stool softener and dulcolax, senna - sees GI in [place]    URINATION: Pain with urination: No Fully empty bladder: Yes:   Stream: Strong Urgency: No Frequency: average Leakage:  none     INTERCOURSE:no active   PREGNANCY: no pregnancies       PROLAPSE: None     OBJECTIVE:    DIAGNOSTIC FINDINGS:  Pelvic floor strength II/V  PVR of 144 ml was obtained by bladder scan  No evidence of urethral prolapse Hymenal ring is slightly more prominent      COGNITION: Overall cognitive status: Within functional limits for tasks assessed                          SENSATION: Light touch: Appears intact Proprioception: Appears intact     POSTURE:  scoliosis in thoracolumbar area     LUMBARAROM/PROM: full lumbar ROM     LOWER EXTREMITY ROM: Full bilateral hip ROM     LOWER EXTREMITY MMT:   MMT Right eval Left eval Right/left  08/02/22 Right/left 11/18/22  Hip abduction 4/5 4/5 4/5 4/5    PALPATION:   General  decreased mobility of the lower rib cage, Tightness of diaphragm, tenderness located on the right lower quadrant, lower abdominal tissue has fascial restrictions.                  External Perineal Exam tenderness along the coccygeus, external anal sphincter                             Internal Pelvic Floor tenderness located on the right obturator internist, right iliococcygeus, external and internal anal sphincter; her pelvic floor muscles relax when she brings her knees to chest. Not able to push therapist finger out of the anal canal.    Patient confirms identification and approves PT to assess internal pelvic floor and treatment Yes   PELVIC MMT:   MMT eval   Internal Anal Sphincter 2/5   External Anal Sphincter 2/5  Puborectalis 2/5   (Blank rows = not tested)         TONE: increased     TODAY'S TREATMENT:  02/07/23 Manual: Soft tissue  mobilization:  To assess for dry needling Manual work to the external anal sphincter externally to elongate the tissue after dry needling.  Trigger Point Dry-Needling  Treatment instructions: Expect mild to moderate muscle soreness. S/S of pneumothorax if dry needled over a lung field, and to seek immediate medical attention should they occur. Patient verbalized understanding of these instructions and education.  Patient Consent Given: Yes Education handout provided: Yes Muscles treated: left external anal sphincter   Electrical stimulation performed: No Parameters: N/A Treatment response/outcome: elongation of muscle and trigger point response  Exercises: Strengthening: Sitting holding ball at shoulder height moving side to side with tactile cues to engage the core Diagonals holding 5# in sitting and tactile cues to engage the  core Dead lift holding 5# kettle bell 20 x Walking with 10# kettle bell in one hand 20 feet each hand Walking side to side holding 10 # in hand held to chest  01/27/23 Manual: Soft tissue mobilization:  To assess for dry needling Manual work to the external anal sphincter externally to elongate the tissue after dry needling.  Trigger Point Dry-Needling  Treatment instructions: Expect mild to moderate muscle soreness. S/S of pneumothorax if dry needled over a lung field, and to seek immediate medical attention should they occur. Patient verbalized understanding of these instructions and education.  Patient Consent Given: Yes Education handout provided: Yes Muscles treated: right external anal sphincter   Electrical stimulation performed: No Parameters: N/A Treatment response/outcome: elongation of muscle and trigger point response Exercises: Stretches/mobility: Laying on side with therapist finger on the outside of the anus and patient will blow out into her fist to generate pressure to push stool out Strengthening: Sitting push on ball that is on her lap  as she blows out.  Sitting with diagonally lifting the ball with breath and breath out when comes down to engage the abdomen and work on breath Sitting holding ball at shoulder height moving side to side Standing pushing the ball into the wall to engage her abdominals   12/30/22 Manual: Soft tissue mobilization: To assess for dry needling Manual work to the left superior transverse and external anal sphincter and levator ani. Trigger Point Dry-Needling  Treatment instructions: Expect mild to moderate muscle soreness. S/S of pneumothorax if dry needled over a lung field, and to seek immediate medical attention should they occur. Patient verbalized understanding of these instructions and education.  Patient Consent Given: Yes Education handout provided: Yes Muscles treated: left external anal sphincter and left superior transverse  Electrical stimulation performed: No Parameters: N/A Treatment response/outcome: elongation of muscle and trigger point response Exercises: Stretches/mobility: Diaphragmatic breathing to expand the anal region for defecation Strengthening: Bridge with green band 15 x  Supine march with green band 20 x Bridge with ball squeeze 15 x Quadruped hip abduction with yellow band 10 X Quadruped lift upper extremity with core engaged 10 x each Quadruped lift lower extremity with core engaged 10 x each         PATIENT EDUCATION:  01/27/23 Education details: Access Code: [email or web address];information on dry needling Person educated: Patient Education method: Explanation, Demonstration, Tactile cues, Verbal cues, Handouts, and you tube video Education comprehension: verbalized understanding, returned demonstration, verbal cues required, tactile cues required, and needs further education   HOME EXERCISE PROGRAM: 01/27/23 Access Code: [email or web address] URL: [email or web address]  Date: 01/27/2023 Prepared by: Eulis Foster  Program Notes sit on ball and massage pelvic  floor  Exercises - - Seated Diagonal Chop with Medicine Ball  - 1 x daily - 3 x weekly - 2 sets - 10 reps - Seated Trunk Rotation with Anchored Resistance  - 1 x daily - 3 x weekly - 2 sets - 10 reps - Seated Abdominal Press into Swiss Ball  - 1 x daily - 3 x weekly - 1 sets - 10 reps - 5 sec hold - Wall Push Up  - 1 x daily - 3 x weekly - 1 sets - 10 reps   ASSESSMENT:   CLINICAL IMPRESSION: Patient is a 22 y.o. female who was seen today for physical therapy  treatment for constipation. She is straining  20% less. She is having bowel movements every couple of days. She is having 35% less pain with bowel movements.  Right quadrant pain is 70% better. Patient is working on abdominal strength to assist with bowel movements.  She still needs tactile cues to engage the abdominals with the exercises. Patient stool are diarrhea for the past several months with yellow coloring at the end of the stool. Patient will benefit from skilled therapy to improve coordination and relaxation of her muscles to reduce pain and constipation.    OBJECTIVE IMPAIRMENTS: decreased activity tolerance, decreased endurance, decreased strength, increased fascial restrictions, increased muscle spasms, and pain.    ACTIVITY LIMITATIONS: toileting   PARTICIPATION LIMITATIONS: community activity   PERSONAL FACTORS: Age, Time since onset of injury/illness/exacerbation, and 3+ comorbidities: Dysautonomia; IBS; POTS; Scoliosis of Thoracolumbar spine; OCD  are also affecting patient's functional outcome.    REHAB POTENTIAL: Excellent   CLINICAL DECISION MAKING: Evolving/moderate complexity   EVALUATION COMPLEXITY: Moderate     GOALS: Goals reviewed with patient? Yes   SHORT TERM GOALS: Target date: 07/11/22   Patient is able to demonstrate correct toileting technique to have a bowel movement.  Baseline: still working on the breath work Goal status: Met 02/07/23   2.  Patient understands how to perform diaphragmatic  breathing with expansion of her rib cage to relax her pelvic floor.  Baseline:  Goal status: Met 08/02/22   3.  Patient understands how to perform abdominal massage to assist with peristalic motion of the intestines.  Baseline:  Goal status: Met 08/02/22   4.  Patient reports her pain reduced >/= 25% due to reduction of trigger points and increased in muscle relaxation.  Baseline: decreased by 20% Goal status: Met 09/23/22    LONG TERM GOALS: Target date: 02/21/23   Patient is independent with advanced HEP to improve pelvic floor coordination for a bowel movement.  Baseline:  Goal status: ongoing 02/07/23   2.  Patient is able to contract the lower abdominals equally with the upper abdominals so she is able to generate correct force to push stool out.  Baseline: still needs verbal cues Goal status: ongoing 02/07/23   3.  Patient pelvic floor muscles are relaxed  and reduced trigger points so her pain decreased >/= 75%.  Baseline: pain decreased by 35% Goal status: ongoing 02/07/23   4.  Patient is able to have a bowel movement 2 times per week due to improve intestinal mobility and relaxation of the pelvic floor.  Baseline: every other day with diarrhea, 1-2 times per week with solid stool Goal status: ongoing 02/07/23   5.  Patient is able to push the therapist finger out of the rectum due  to improve coordination of her pelvic floor.  Baseline:  Goal status: ongoing 11/18/22       PLAN:   PT FREQUENCY: 1x/week   PT DURATION: 12 weeks   PLANNED INTERVENTIONS: Therapeutic exercises, Therapeutic activity, Neuromuscular re-education, Patient/Family education, Joint mobilization, Dry Needling, Electrical stimulation, Spinal mobilization, Cryotherapy, Moist heat, Taping, Ultrasound, Biofeedback, and Manual therapy   PLAN FOR NEXT SESSION:  dry needling  to rightt pelvic floor, work on core and back strength   Eulis Foster, PT 02/07/23 8:54 AM

## 2023-02-10 ENCOUNTER — Ambulatory Visit: Payer: 59 | Admitting: Adult Health

## 2023-02-10 ENCOUNTER — Other Ambulatory Visit (HOSPITAL_COMMUNITY): Payer: Self-pay

## 2023-02-10 ENCOUNTER — Ambulatory Visit: Payer: 59 | Admitting: Physical Therapy

## 2023-02-10 ENCOUNTER — Other Ambulatory Visit: Payer: Self-pay

## 2023-02-10 ENCOUNTER — Encounter: Payer: Self-pay | Admitting: Physical Therapy

## 2023-02-10 ENCOUNTER — Encounter: Payer: Self-pay | Admitting: Adult Health

## 2023-02-10 DIAGNOSIS — F5082 Avoidant/restrictive food intake disorder: Secondary | ICD-10-CM

## 2023-02-10 DIAGNOSIS — R1031 Right lower quadrant pain: Secondary | ICD-10-CM

## 2023-02-10 DIAGNOSIS — M6281 Muscle weakness (generalized): Secondary | ICD-10-CM

## 2023-02-10 DIAGNOSIS — F341 Dysthymic disorder: Secondary | ICD-10-CM | POA: Diagnosis not present

## 2023-02-10 DIAGNOSIS — F422 Mixed obsessional thoughts and acts: Secondary | ICD-10-CM

## 2023-02-10 DIAGNOSIS — R279 Unspecified lack of coordination: Secondary | ICD-10-CM | POA: Diagnosis not present

## 2023-02-10 MED ORDER — ARIPIPRAZOLE 2 MG PO TABS
2.0000 mg | ORAL_TABLET | Freq: Every day | ORAL | 3 refills | Status: DC
Start: 2023-02-10 — End: 2023-12-16
  Filled 2023-02-10: qty 90, 90d supply, fill #0
  Filled 2023-05-10: qty 90, 90d supply, fill #1
  Filled 2023-08-06: qty 90, 90d supply, fill #2

## 2023-02-10 MED ORDER — SERTRALINE HCL 100 MG PO TABS
200.0000 mg | ORAL_TABLET | Freq: Every day | ORAL | 3 refills | Status: DC
Start: 2023-02-10 — End: 2023-12-16
  Filled 2023-02-10 – 2023-03-18 (×2): qty 180, 90d supply, fill #0
  Filled 2023-06-19: qty 180, 90d supply, fill #1
  Filled 2023-08-06 – 2023-09-14 (×2): qty 180, 90d supply, fill #2

## 2023-02-10 MED ORDER — TRAZODONE HCL 50 MG PO TABS
50.0000 mg | ORAL_TABLET | Freq: Every day | ORAL | 3 refills | Status: DC
  Filled 2023-02-10: qty 90, 90d supply, fill #0
  Filled 2023-05-23: qty 90, 90d supply, fill #1
  Filled 2023-08-06: qty 90, 90d supply, fill #2
  Filled 2023-09-27 – 2023-10-21 (×2): qty 90, 90d supply, fill #3

## 2023-02-10 NOTE — Progress Notes (Signed)
April Williams [id number] 2001/02/14 22 y.o.  Subjective:   Patient ID:  April Williams is a 22 y.o. (DOB 06/17/2000) female.  Chief Complaint: No chief complaint on file.   HPI April Williams presents to the office today for follow-up of persistent depressive disorder with melancholic features, currently moderate, Mixed obsessional thoughts and acts, and avoidant-restrictive food intake disorder (ARFID).  Describes mood today as "ok". Pleasant. Denies tearfulness. Mood symptoms - denies depression, anxiety, and irritability. Denies worry, rumination and over thinking. Mood is consistent. Stating "I'm doing pretty good". Feels like medications work well. Stable interest and motivation. Taking medications as prescribed.  Energy levels pretty good. Has a regular exercise routine. Enjoys some usual interests and activities. Single. Dating. Lives with mother. Sister lives 1.5 hours away - niece 55 years old and nephew - a few months old. Father and brother lives in [place]. Spending time with family. Appetite adequate. Weight stable. Sleeps well most nights with Trazadone. Averages 8 to 10 hours.  Focus and concentration stable. Completing some household tasks. Managing aspects of household. Taking classes at a community college - [employer]. Denies SI or HI.  Denies AH or VH. Denies self harm. Denies substance use.  Previous medication trials: Pristiq   GAD-7    Flowsheet Row Office Visit from 09/20/2021 in [facility] from 04/07/2018 in [facility] Office Visit from 09/02/2017 in [facility] Office Visit from 08/01/2017 in [facility] Office Visit from 06/30/2017 in [facility]  Total GAD-7 Score 4 5 1 5 2       PHQ2-9    Flowsheet Row Office Visit from 02/03/2023  in [facility] Office Visit from 09/20/2021 in [facility] from 04/07/2018 in [facility] Office Visit from 09/02/2017 in [facility] Office Visit from 08/01/2017 in [facility]  PHQ-2 Total Score 2 3 2  0 1  PHQ-9 Total Score 3 8 7 9 11         Review of Systems:  Review of Systems  Musculoskeletal:  Negative for gait problem.  Neurological:  Negative for tremors.  Psychiatric/Behavioral:         Please refer to HPI    Medications: I have reviewed the patient's current medications.  Current Outpatient Medications  Medication Sig Dispense Refill   ARIPiprazole (ABILIFY) 2 MG tablet Take 1 tablet (2 mg total) by mouth daily. 90 tablet 3   cholecalciferol (VITAMIN D) 1000 units tablet Take 4,000 Units by mouth daily.     ferrous sulfate 324 (65 Fe) MG TBEC Take 1 tablet by mouth in the morning and 1 tablet in the evening with meals. If causes constipation, begin miralax daily as needed. Check Hgb/ferritin in 3 months.. 90 tablet 3   fludrocortisone (FLORINEF) 0.1 MG tablet Take 0.2 mg by mouth daily.     Magnesium Hydroxide 1200 MG CHEW Chew 1,200 mg by mouth daily.     Melatonin 10 MG TABS Take by mouth.     metoprolol tartrate (LOPRESSOR) 25 MG tablet TAKE 1 TABLET BY MOUTH ONCE A DAY 90 tablet 2   midodrine (PROAMATINE) 5 MG tablet Take 2 tablets (10 mg total) by mouth 3 (three) times daily. 540 tablet 3   norethindrone (MICRONOR)  0.35 MG tablet Take 1 tablet (0.35 mg total) by mouth daily. 84 tablet 5   senna (SENOKOT) 8.6 MG tablet Take 2 tablets by mouth daily.     sertraline (ZOLOFT) 100 MG tablet Take 2 tablets (200 mg total) by mouth daily. 180 tablet 3   traZODone (DESYREL) 50 MG tablet Take 1 tablet (50 mg total) by mouth at bedtime. 90 tablet 3   No current  facility-administered medications for this visit.    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   Please see review of systems for further details on the patient's review from today.   Objective:   Physical Exam:  LMP  (LMP Unknown) Comment: may be a week or two ago  Physical Exam Constitutional:      General: She is not in acute distress. Musculoskeletal:        General: No deformity.  Neurological:     Mental Status: She is alert and oriented to person, place, and time.     Cranial Nerves: No dysarthria.     Coordination: Coordination normal.  Psychiatric:        Attention and Perception: Attention and perception normal. She does not perceive auditory or visual hallucinations.        Mood and Affect: Mood normal. Affect is not labile, blunt, angry or inappropriate.        Speech: Speech normal.        Behavior: Behavior normal. Behavior is cooperative.        Thought Content: Thought content normal. Thought content is not paranoid or delusional. Thought content does not include homicidal or suicidal ideation. Thought content does not include homicidal or suicidal plan.        Cognition and Memory: Cognition and memory normal.        Judgment: Judgment normal.      Comments: Insight intact     Lab Review:     Component Value Date/Time   NA 136 01/27/2019 1015   K 4.9 01/27/2019 1015   CL 105 01/27/2019 1015   CO2 22 01/27/2019 1015   GLUCOSE 91 01/27/2019 1015   BUN 9 01/27/2019 1015   CREATININE 0.70 01/27/2019 1015   CALCIUM 9.9 01/27/2019 1015   PROT 7.2 01/27/2019 1015   ALBUMIN 4.1 12/07/2016 1402   AST 13 01/27/2019 1015   ALT 10 01/27/2019 1015   ALKPHOS 117 12/07/2016 1402   BILITOT 0.7 01/27/2019 1015   GFRNONAA NOT CALCULATED 12/07/2016 1402   GFRAA NOT CALCULATED 12/07/2016 1402       Component Value Date/Time   WBC 10.6 (H) 10/28/2022 1236   WBC 8.8 12/07/2016 1402   RBC 5.52 (H) 10/28/2022 1236  HGB 15.6 (H) 10/28/2022 1236   HCT 45.2 10/28/2022 1236   PLT 456 (H) 10/28/2022 1236   MCV 81.9 10/28/2022 1236   MCH 28.3 10/28/2022 1236   MCHC 34.5 10/28/2022 1236   RDW 12.0 10/28/2022 1236   LYMPHSABS 1.9 10/28/2022 1236   MONOABS 0.7 10/28/2022 1236   EOSABS 0.1 10/28/2022 1236   BASOSABS 0.0 10/28/2022 1236    No results found for: "POCLITH", "LITHIUM"   No results found for: "PHENYTOIN", "PHENOBARB", "VALPROATE", "CBMZ"   .res Assessment: Plan:    Plan:  PDMP reviewed  1. Zoloft 200mg  daily 2. Abilify 2mg  daily 3. Trazadone 25mg  at hs    RTC 6 months  Discussed potential metabolic side effects associated with atypical antipsychotics, as well as potential risk for movement side effects. Advised pt to contact office if movement side effects occur.   There are no diagnoses linked to this encounter.   Please see After Visit Summary for patient specific instructions.  Future Appointments  Date Time Provider Department Center  02/10/2023  1:00 PM Jazmin Ley, Thereasa Solo, NP [facility] None  02/10/2023  2:45 PM Theressa Millard, PT [facility] None  02/19/2023 11:45 AM Theressa Millard, PT [facility] None  05/02/2023 10:15 AM Theressa Millard, PT [facility] None  05/05/2023  4:15 PM Theressa Millard, PT [facility] None   05/16/2023  9:30 AM Theressa Millard, PT [facility] None  05/19/2023  9:30 AM Theressa Millard, PT [facility] None  05/30/2023  9:30 AM Theressa Millard, PT [facility] None  06/02/2023  9:30 AM Theressa Millard, PT [facility] None  02/10/2024  9:15 AM Early, Sung Amabile, NP [facility]    No orders of the defined types were placed in this encounter.   -------------------------------

## 2023-02-10 NOTE — Therapy (Signed)
OUTPATIENT PHYSICAL THERAPY TREATMENT NOTE   Patient Name: April Williams MRN: [id number] DOB:December 23, 2000, 22 y.o., female 51 Date: 02/10/2023  PCP: Tollie Eth, NP  REFERRING PROVIDER:  Federico Flake, MD   END OF SESSION:   PT End of Session - 02/10/23 1436     Visit Number 13    Date for PT Re-Evaluation 02/21/23    Authorization Type [facility]    PT Start Time 1440    PT Stop Time 1525    PT Time Calculation (min) 45 min    Activity Tolerance Patient tolerated treatment well             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    Irritable bowel syndrome    Lump of right breast 06/30/2018   Nasal polyps    Obsessive-compulsive disorder    Physical deconditioning 04/21/2018   Plantar wart    POTS (postural orthostatic tachycardia syndrome)    Scoliosis of thoracolumbar spine    Vitamin D deficiency    Past Surgical History:  Procedure Laterality Date   NASAL SINUS SURGERY  06/12/2017   wisdom tooth removal     Patient Active Problem List   Diagnosis Date Noted   Family history of clotting disorder 10/28/2022   Iron deficiency 10/28/2022   Encounter for annual physical exam 03/21/2022   Ectopic atrial rhythm 06/13/2020   Irritable bowel syndrome    Obsessive compulsive disorder 06/15/2019   Persistent depressive disorder with melancholic features, currently moderate 06/15/2019   Avoidant-restrictive food intake disorder (ARFID) 06/15/2019   Adjustment disorder with mixed anxiety and depressed mood 07/28/2018   Dysautonomia (HCC) 09/02/2017   Adolescent idiopathic scoliosis of thoracolumbar region 08/04/2017   Delayed sleep phase syndrome 08/01/2017   POTS (postural orthostatic tachycardia  syndrome) 06/30/2017   Chronic daily headache 06/30/2017   Chronic pansinusitis 05/27/2017   REFERRING DIAG: K59.04 (ICD-10-CM) - Chronic idiopathic constipation    THERAPY DIAG:  Muscle weakness (generalized)   Unspecified lack of coordination   Right lower quadrant abdominal pain   Rationale for Evaluation and Treatment: Rehabilitation   ONSET DATE: 2021   SUBJECTIVE:  SUBJECTIVE STATEMENT: No changes from last visit.     PAIN:  Are you having pain? Yes NPRS scale: 0/10, 12/30/22 Pain location:  right lower abdominal   Pain type: aching and dull Pain description: intermittent    Aggravating factors: certain foods, eating, sitting on commode with feet on squatty potty increases rectal pain Relieving factors: occasional enemas  PAIN:  Are you having pain? Yes:12/30/22 NPRS scale: 0/10 Pain location: rectum Pain description: sharp and goes posteriorly and goes to the tailbone Aggravating factors: passing stool Relieving factors: relax her muscles      PRECAUTIONS: None   WEIGHT BEARING RESTRICTIONS: No   FALLS:  Has patient fallen in last 6 months? No   LIVING ENVIRONMENT: Lives with: lives with their family     OCCUPATION: student   PLOF: Independent   PATIENT GOALS: reduce pain    PERTINENT HISTORY:  Dysautonomia; IBS; POTS; Scoliosis of Thoracolumbar spine; OCD   BOWEL MOVEMENT: Pain with bowel movement: Yes Type of bowel movement:Type (Bristol Stool Scale) Type 3, 6, 7, Frequency weekly, Strain Yes, and Splinting no Fully empty rectum: Yes Leakage: No Fiber supplement: stool softener and dulcolax, senna - sees GI in [place]    URINATION: Pain with urination: No Fully empty bladder: Yes:   Stream: Strong Urgency: No Frequency: average Leakage:  none      INTERCOURSE:no active   PREGNANCY: no pregnancies       PROLAPSE: None     OBJECTIVE:    DIAGNOSTIC FINDINGS:  Pelvic floor strength II/V  PVR of 144 ml was obtained by bladder scan  No evidence of urethral prolapse Hymenal ring is slightly more prominent      COGNITION: Overall cognitive status: Within functional limits for tasks assessed                          SENSATION: Light touch: Appears intact Proprioception: Appears intact     POSTURE:  scoliosis in thoracolumbar area     LUMBARAROM/PROM: full lumbar ROM     LOWER EXTREMITY ROM: Full bilateral hip ROM     LOWER EXTREMITY MMT:   MMT Right eval Left eval Right/left  08/02/22 Right/left 11/18/22  Hip abduction 4/5 4/5 4/5 4/5    PALPATION:   General  decreased mobility of the lower rib cage, Tightness of diaphragm, tenderness located on the right lower quadrant, lower abdominal tissue has fascial restrictions.                  External Perineal Exam tenderness along the coccygeus, external anal sphincter                             Internal Pelvic Floor tenderness located on the right obturator internist, right iliococcygeus, external and internal anal sphincter; her pelvic floor muscles relax when she brings her knees to chest. Not able to push therapist finger out of the anal canal.    Patient confirms identification and approves PT to assess internal pelvic floor and treatment Yes   PELVIC MMT:   MMT eval   Internal Anal Sphincter 2/5   External Anal Sphincter 2/5   Puborectalis 2/5   (Blank rows = not tested)         TONE: increased     TODAY'S TREATMENT:  02/10/23 Manual: Soft tissue mobilization:  To assess for dry needling Manual work to the external anal sphincter and superior transverse  perineum externally to elongate the tissue after dry needling.  Trigger Point Dry-Needling  Treatment instructions: Expect mild to moderate muscle soreness. S/S of pneumothorax if dry needled  over a lung field, and to seek immediate medical attention should they occur. Patient verbalized understanding of these instructions and education.  Patient Consent Given: Yes Education handout provided: Yes Muscles treated: Right external anal sphincter , superior transverse perineum Electrical stimulation performed: No Parameters: N/A Treatment response/outcome: elongation of muscle and trigger point response Exercises: Strengthening: Diagonals holding 5# in sitting and tactile cues to engage the  core in tall kneel Walking with 10# kettle bell in one hand 20 feet each hand 4 times with tactile cues to bring shoulder down and cues for abdominals contracted Lay on side and hip abduction with pressing into ball with tactile cues to engage the core 10 x 2 each side Stand on foam and bil. Lower trap with green band  Stand on foam and bilateral shoulder extension with green band Stand on foam and diagonals with green band PLANK wall pushups with keeping shoulder down with arms at sides for triceps then arms at shoulder for pectoralis    02/07/23 Manual: Soft tissue mobilization:  To assess for dry needling Manual work to the external anal sphincter externally to elongate the tissue after dry needling.  Trigger Point Dry-Needling  Treatment instructions: Expect mild to moderate muscle soreness. S/S of pneumothorax if dry needled over a lung field, and to seek immediate medical attention should they occur. Patient verbalized understanding of these instructions and education.  Patient Consent Given: Yes Education handout provided: Yes Muscles treated: left external anal sphincter   Electrical stimulation performed: No Parameters: N/A Treatment response/outcome: elongation of muscle and trigger point response  Exercises: Strengthening: Sitting holding ball at shoulder height moving side to side with tactile cues to engage the core Diagonals holding 5# in sitting and tactile cues to  engage the  core Dead lift holding 5# kettle bell 20 x Walking with 10# kettle bell in one hand 20 feet each hand Walking side to side holding 10 # in hand held to chest  01/27/23 Manual: Soft tissue mobilization:  To assess for dry needling Manual work to the external anal sphincter externally to elongate the tissue after dry needling.  Trigger Point Dry-Needling  Treatment instructions: Expect mild to moderate muscle soreness. S/S of pneumothorax if dry needled over a lung field, and to seek immediate medical attention should they occur. Patient verbalized understanding of these instructions and education.  Patient Consent Given: Yes Education handout provided: Yes Muscles treated: right external anal sphincter   Electrical stimulation performed: No Parameters: N/A Treatment response/outcome: elongation of muscle and trigger point response Exercises: Stretches/mobility: Laying on side with therapist finger on the outside of the anus and patient will blow out into her fist to generate pressure to push stool out Strengthening: Sitting push on ball that is on her lap as she blows out.  Sitting with diagonally lifting the ball with breath and breath out when comes down to engage the abdomen and work on breath Sitting holding ball at shoulder height moving side to side Standing pushing the ball into the wall to engage her abdominals       PATIENT EDUCATION:  01/27/23 Education details: Access Code: [email or web address];information on dry needling Person educated: Patient Education method: Explanation, Demonstration, Tactile cues, Verbal cues, Handouts, and you tube video Education comprehension: verbalized understanding, returned demonstration, verbal cues required, tactile cues required, and needs further  education   HOME EXERCISE PROGRAM: 01/27/23 Access Code: [email or web address] URL: [email or web address] Date: 01/27/2023 Prepared by: Eulis Foster  Program Notes sit on ball and massage  pelvic floor  Exercises - - Seated Diagonal Chop with Medicine Ball  - 1 x daily - 3 x weekly - 2 sets - 10 reps - Seated Trunk Rotation with Anchored Resistance  - 1 x daily - 3 x weekly - 2 sets - 10 reps - Seated Abdominal Press into Swiss Ball  - 1 x daily - 3 x weekly - 1 sets - 10 reps - 5 sec hold - Wall Push Up  - 1 x daily - 3 x weekly - 1 sets - 10 reps   ASSESSMENT:   CLINICAL IMPRESSION: Patient is a 22 y.o. female who was seen today for physical therapy  treatment for constipation.  Patient has not strained this weekend. Patient is working on her core and needs reminders to contract her abdominals and keep shoulders down. Patient still has trigger points in the anal sphincters and superior transverse perineum.  Patient will benefit from skilled therapy to improve coordination and relaxation of her muscles to reduce pain and constipation.    OBJECTIVE IMPAIRMENTS: decreased activity tolerance, decreased endurance, decreased strength, increased fascial restrictions, increased muscle spasms, and pain.    ACTIVITY LIMITATIONS: toileting   PARTICIPATION LIMITATIONS: community activity   PERSONAL FACTORS: Age, Time since onset of injury/illness/exacerbation, and 3+ comorbidities: Dysautonomia; IBS; POTS; Scoliosis of Thoracolumbar spine; OCD  are also affecting patient's functional outcome.    REHAB POTENTIAL: Excellent   CLINICAL DECISION MAKING: Evolving/moderate complexity   EVALUATION COMPLEXITY: Moderate     GOALS: Goals reviewed with patient? Yes   SHORT TERM GOALS: Target date: 07/11/22   Patient is able to demonstrate correct toileting technique to have a bowel movement.  Baseline: still working on the breath work Goal status: Met 02/07/23   2.  Patient understands how to perform diaphragmatic breathing with expansion of her rib cage to relax her pelvic floor.  Baseline:  Goal status: Met 08/02/22   3.  Patient understands how to perform abdominal massage to  assist with peristalic motion of the intestines.  Baseline:  Goal status: Met 08/02/22   4.  Patient reports her pain reduced >/= 25% due to reduction of trigger points and increased in muscle relaxation.  Baseline: decreased by 20% Goal status: Met 09/23/22    LONG TERM GOALS: Target date: 02/21/23   Patient is independent with advanced HEP to improve pelvic floor coordination for a bowel movement.  Baseline:  Goal status: ongoing 02/07/23   2.  Patient is able to contract the lower abdominals equally with the upper abdominals so she is able to generate correct force to push stool out.  Baseline: still needs verbal cues Goal status: ongoing 02/07/23   3.  Patient pelvic floor muscles are relaxed  and reduced trigger points so her pain decreased >/= 75%.  Baseline: pain decreased by 35% Goal status: ongoing 02/07/23   4.  Patient is able to have a bowel movement 2 times per week due to improve intestinal mobility and relaxation of the pelvic floor.  Baseline: every other day with diarrhea, 1-2 times per week with solid stool Goal status: ongoing 02/07/23   5.  Patient is able to push the therapist finger out of the rectum due to improve coordination of her pelvic floor.  Baseline:  Goal status: ongoing 11/18/22       PLAN:  PT FREQUENCY: 1x/week   PT DURATION: 12 weeks   PLANNED INTERVENTIONS: Therapeutic exercises, Therapeutic activity, Neuromuscular re-education, Patient/Family education, Joint mobilization, Dry Needling, Electrical stimulation, Spinal mobilization, Cryotherapy, Moist heat, Taping, Ultrasound, Biofeedback, and Manual therapy   PLAN FOR NEXT SESSION:  dry needling  to rightt pelvic floor, work on core and back strength, update HEP; renewal needed to continue   Eulis Foster, PT 02/10/23 3:26 PM

## 2023-02-11 ENCOUNTER — Encounter: Payer: Self-pay | Admitting: Nurse Practitioner

## 2023-02-11 DIAGNOSIS — N920 Excessive and frequent menstruation with regular cycle: Secondary | ICD-10-CM | POA: Insufficient documentation

## 2023-02-11 NOTE — Assessment & Plan Note (Signed)
Family history of clotting disorder with heavy menses and clotting present. At this time she is not interested in further testing. She has been seen by hematology for this in the past and testing was discouraged. Will follow.

## 2023-02-11 NOTE — Assessment & Plan Note (Signed)
Mood reported as stable/consistent. She declines any new or worsening symptoms or need for escalation of care at this time. She is currently not seeing a counselor due to her previous counselor leaving, but declines the offer for referral at this time. Discussed the importance of mental health support and to reach out immediately if she feels her mental health is wavering in any way.

## 2023-02-11 NOTE — Assessment & Plan Note (Signed)
See adjustment disorder

## 2023-02-11 NOTE — Assessment & Plan Note (Signed)
CPE completed today. Review of HM activities and recommendations discussed and provided on AVS. Anticipatory guidance, diet, and exercise recommendations provided. Medications, allergies, and hx reviewed and updated as necessary. Orders placed as listed below.  Plan: - Labs  reviewed from recent encounter. No changes needed.  . - F/U with CPE in 1 year or sooner for acute/chronic health needs as directed.

## 2023-02-11 NOTE — Assessment & Plan Note (Signed)
No recent flare-ups. Currently on midodrine 10 mg three times daily. Follow-up with cardiologist in 5 months. - Continue midodrine 10 mg three times daily - Follow up with cardiologist in 5 months

## 2023-02-11 NOTE — Assessment & Plan Note (Signed)
Currently managed with cardiology on midodrine 10mg  TID. She follow-up with cardiologist in 5 months. She will likely need to transition to an adult cardiologist in the near future, we will work to help make that transition smooth when the time comes.

## 2023-02-11 NOTE — Assessment & Plan Note (Addendum)
Reports heavy menstrual bleeding with clots. Naproxen ineffective for reducing bleeding. Ibuprofen 600-800 mg suggested for use the day before and first 2-3 days of period if symptoms allow. - Consider ibuprofen 600-800 mg the day before and first 2-3 days of period if symptoms allow - Do not take naproxen and ibuprofen at the same time, but may use one instead of the other if the symptoms are better controlled with one.

## 2023-02-19 ENCOUNTER — Encounter: Payer: Self-pay | Admitting: Physical Therapy

## 2023-02-19 ENCOUNTER — Ambulatory Visit: Payer: 59 | Admitting: Physical Therapy

## 2023-02-19 DIAGNOSIS — R1031 Right lower quadrant pain: Secondary | ICD-10-CM | POA: Diagnosis not present

## 2023-02-19 DIAGNOSIS — M6281 Muscle weakness (generalized): Secondary | ICD-10-CM | POA: Diagnosis not present

## 2023-02-19 DIAGNOSIS — R279 Unspecified lack of coordination: Secondary | ICD-10-CM

## 2023-02-19 NOTE — Therapy (Signed)
OUTPATIENT PHYSICAL THERAPY TREATMENT NOTE   Patient Name: April Williams MRN: [id number] DOB:May 14, 2000, 22 y.o., female 47 Date: 02/19/2023  PCP: Tollie Eth, NP  REFERRING PROVIDER:  Federico Flake, MD   END OF SESSION:   PT End of Session - 02/19/23 1143     Visit Number 14    Date for PT Re-Evaluation 02/21/23    Authorization Type [facility]    PT Start Time 1145    PT Stop Time 1225    PT Time Calculation (min) 40 min    Activity Tolerance Patient tolerated treatment well    Behavior During Therapy [facility] for tasks assessed/performed             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    Irritable bowel syndrome    Lump of right breast 06/30/2018   Nasal polyps    Obsessive-compulsive disorder    Physical deconditioning 04/21/2018   Plantar wart    POTS (postural orthostatic tachycardia syndrome)    Scoliosis of thoracolumbar spine    Vitamin D deficiency    Past Surgical History:  Procedure Laterality Date   NASAL SINUS SURGERY  06/12/2017   wisdom tooth removal     Patient Active Problem List   Diagnosis Date Noted   Menorrhagia with regular cycle 02/11/2023   Family history of clotting disorder 10/28/2022   Iron deficiency 10/28/2022   Encounter for annual physical exam 03/21/2022   Ectopic atrial rhythm 06/13/2020   Irritable bowel syndrome    Obsessive compulsive disorder 06/15/2019   Persistent depressive disorder with melancholic features, currently moderate 06/15/2019   Adjustment disorder with mixed anxiety and depressed mood 07/28/2018    Dysautonomia (HCC) 09/02/2017   Adolescent idiopathic scoliosis of thoracolumbar region 08/04/2017   Delayed sleep phase syndrome 08/01/2017   POTS (postural orthostatic tachycardia syndrome) 06/30/2017   REFERRING DIAG: K59.04 (ICD-10-CM) - Chronic idiopathic constipation    THERAPY DIAG:  Muscle weakness (generalized)   Unspecified lack of coordination   Right lower quadrant abdominal pain   Rationale for Evaluation and Treatment: Rehabilitation   ONSET DATE: 2021   SUBJECTIVE:  SUBJECTIVE STATEMENT: I felt good from last visit. I felt tingling in muscles afterwards. I still have diarrhea. The abdominal pain is less.  No changes from last visit.     PAIN:  Are you having pain? Yes NPRS scale: 0/10, 12/30/22 Pain location:  right lower abdominal   Pain type: aching and dull Pain description: intermittent    Aggravating factors: certain foods, eating, sitting on commode with feet on squatty potty increases rectal pain Relieving factors: occasional enemas  PAIN:  Are you having pain? Yes:12/30/22 NPRS scale: 0/10 Pain location: rectum Pain description: sharp and goes posteriorly and goes to the tailbone Aggravating factors: passing stool Relieving factors: relax her muscles      PRECAUTIONS: None   WEIGHT BEARING RESTRICTIONS: No   FALLS:  Has patient fallen in last 6 months? No   LIVING ENVIRONMENT: Lives with: lives with their family     OCCUPATION: student   PLOF: Independent   PATIENT GOALS: reduce pain    PERTINENT HISTORY:  Dysautonomia; IBS; POTS; Scoliosis of Thoracolumbar spine; OCD   BOWEL MOVEMENT: Pain with bowel movement: Yes Type of bowel movement:Type (Bristol Stool Scale) Type 3, 6, 7, Frequency weekly, Strain Yes, and Splinting no Fully empty rectum:  Yes Leakage: No Fiber supplement: stool softener and dulcolax, senna - sees GI in [place]    URINATION: Pain with urination: No Fully empty bladder: Yes:   Stream: Strong Urgency: No Frequency: average Leakage:  none     INTERCOURSE:no active   PREGNANCY: no pregnancies       PROLAPSE: None     OBJECTIVE:    DIAGNOSTIC FINDINGS:  Pelvic floor strength II/V  PVR of 144 ml was obtained by bladder scan  No evidence of urethral prolapse Hymenal ring is slightly more prominent      COGNITION: Overall cognitive status: Within functional limits for tasks assessed                          SENSATION: Light touch: Appears intact Proprioception: Appears intact     POSTURE:  scoliosis in thoracolumbar area     LUMBARAROM/PROM: full lumbar ROM     LOWER EXTREMITY ROM: Full bilateral hip ROM     LOWER EXTREMITY MMT:   MMT Right eval Left eval Right/left  08/02/22 Right/left 11/18/22 Right/left 02/19/23  Hip abduction 4/5 4/5 4/5 4/5 4/5    PALPATION:   General  decreased mobility of the lower rib cage, Tightness of diaphragm, tenderness located on the right lower quadrant, lower abdominal tissue has fascial restrictions.                  External Perineal Exam tenderness along the coccygeus, external anal sphincter                             Internal Pelvic Floor tenderness located on the right obturator internist, right iliococcygeus, external and internal anal sphincter; her pelvic floor muscles relax when she brings her knees to chest. Not able to push therapist finger out of the anal canal.    Patient confirms identification and approves PT to assess internal pelvic floor and treatment Yes   PELVIC MMT:   MMT eval 02/19/23  Internal Anal Sphincter 2/5 2/5  External Anal Sphincter 2/5 2/5  Puborectalis 2/5 2/5  (Blank rows = not tested)         TONE: increased  TODAY'S TREATMENT:  02/19/23 Exercises: Strengthening: Nustep level 2 for 5  minutes while assess patient, resistance was easy Leg press 80 # 10 x Side step to BOSU ball with chair in front due to balance Standing hip flexion with ball and wall between leg to work core and hip strength Outside foot on 4 inch step and pull green band to outside foot Bilateral shoulder extension with green band with tactile cues to retract the shoulders Squat with bilateral shoulder retraction with green band Alternate hip flexion isometric with core engagement    02/10/23 Manual: Soft tissue mobilization:  To assess for dry needling Manual work to the external anal sphincter and superior transverse perineum externally to elongate the tissue after dry needling.  Trigger Point Dry-Needling  Treatment instructions: Expect mild to moderate muscle soreness. S/S of pneumothorax if dry needled over a lung field, and to seek immediate medical attention should they occur. Patient verbalized understanding of these instructions and education.  Patient Consent Given: Yes Education handout provided: Yes Muscles treated: Right external anal sphincter , superior transverse perineum Electrical stimulation performed: No Parameters: N/A Treatment response/outcome: elongation of muscle and trigger point response Exercises: Strengthening: Diagonals holding 5# in sitting and tactile cues to engage the  core in tall kneel Walking with 10# kettle bell in one hand 20 feet each hand 4 times with tactile cues to bring shoulder down and cues for abdominals contracted Lay on side and hip abduction with pressing into ball with tactile cues to engage the core 10 x 2 each side Stand on foam and bil. Lower trap with green band  Stand on foam and bilateral shoulder extension with green band Stand on foam and diagonals with green band PLANK wall pushups with keeping shoulder down with arms at sides for triceps then arms at shoulder for pectoralis    02/07/23 Manual: Soft tissue mobilization:  To assess  for dry needling Manual work to the external anal sphincter externally to elongate the tissue after dry needling.  Trigger Point Dry-Needling  Treatment instructions: Expect mild to moderate muscle soreness. S/S of pneumothorax if dry needled over a lung field, and to seek immediate medical attention should they occur. Patient verbalized understanding of these instructions and education.  Patient Consent Given: Yes Education handout provided: Yes Muscles treated: left external anal sphincter   Electrical stimulation performed: No Parameters: N/A Treatment response/outcome: elongation of muscle and trigger point response  Exercises: Strengthening: Sitting holding ball at shoulder height moving side to side with tactile cues to engage the core Diagonals holding 5# in sitting and tactile cues to engage the  core Dead lift holding 5# kettle bell 20 x Walking with 10# kettle bell in one hand 20 feet each hand Walking side to side holding 10 # in hand held to chest      PATIENT EDUCATION:  01/27/23 Education details: Access Code: [email or web address];information on dry needling Person educated: Patient Education method: Explanation, Demonstration, Tactile cues, Verbal cues, Handouts, and you tube video Education comprehension: verbalized understanding, returned demonstration, verbal cues required, tactile cues required, and needs further education   HOME EXERCISE PROGRAM: 01/27/23 Access Code: [email or web address] URL: [email or web address] Date: 01/27/2023 Prepared by: Eulis Foster  Program Notes sit on ball and massage pelvic floor  Exercises - - Seated Diagonal Chop with Medicine Ball  - 1 x daily - 3 x weekly - 2 sets - 10 reps - Seated Trunk Rotation with Anchored Resistance  - 1 x daily - 3 x weekly - 2  sets - 10 reps - Seated Abdominal Press into [employer]  - 1 x daily - 3 x weekly - 1 sets - 10 reps - 5 sec hold - Wall Push Up  - 1 x daily - 3 x weekly - 1 sets - 10 reps    ASSESSMENT:   CLINICAL IMPRESSION: Patient is a 22 y.o. female who was seen today for physical therapy  treatment for constipation.  Patient has not strained this weekend. Patient is working on her core and needs reminders to contract her abdominals and keep shoulders down. She is getting her core stronger to push stool out. Her right lower abdominal pain has decreased significantly. Patient continues to have diarrhea. She was tired after her exercise today.  Patient will benefit from skilled therapy to improve coordination and relaxation of her muscles to reduce pain and constipation.    OBJECTIVE IMPAIRMENTS: decreased activity tolerance, decreased endurance, decreased strength, increased fascial restrictions, increased muscle spasms, and pain.    ACTIVITY LIMITATIONS: toileting   PARTICIPATION LIMITATIONS: community activity   PERSONAL FACTORS: Age, Time since onset of injury/illness/exacerbation, and 3+ comorbidities: Dysautonomia; IBS; POTS; Scoliosis of Thoracolumbar spine; OCD  are also affecting patient's functional outcome.    REHAB POTENTIAL: Excellent   CLINICAL DECISION MAKING: Evolving/moderate complexity   EVALUATION COMPLEXITY: Moderate     GOALS: Goals reviewed with patient? Yes   SHORT TERM GOALS: Target date: 07/11/22   Patient is able to demonstrate correct toileting technique to have a bowel movement.  Baseline: still working on the breath work Goal status: Met 02/07/23   2.  Patient understands how to perform diaphragmatic breathing with expansion of her rib cage to relax her pelvic floor.  Baseline:  Goal status: Met 08/02/22   3.  Patient understands how to perform abdominal massage to assist with peristalic motion of the intestines.  Baseline:  Goal status: Met 08/02/22   4.  Patient reports her pain reduced >/= 25% due to reduction of trigger points and increased in muscle relaxation.  Baseline: decreased by 20% Goal status: Met 09/23/22    LONG TERM  GOALS: Target date: 06/21/23   Patient is independent with advanced HEP to improve pelvic floor coordination for a bowel movement.  Baseline:  Goal status: ongoing 02/19/23   2.  Patient is able to contract the lower abdominals equally with the upper abdominals so she is able to generate correct force to push stool out.  Baseline: still needs verbal cues Goal status: ongoing 02/19/23   3.  Patient pelvic floor muscles are relaxed  and reduced trigger points so her pain decreased >/= 75%.  Baseline: pain decreased by 35% Goal status: ongoing 02/19/23   4.  Patient is able to have a bowel movement 2 times per week due to improve intestinal mobility and relaxation of the pelvic floor.  Baseline: every other day with diarrhea, 1-2 times per week with solid stool Goal status: ongoing 02/19/23   5.  Patient is able to push the therapist finger out of the rectum due to improve coordination of her pelvic floor.  Baseline:  Goal status: ongoing 02/19/23       PLAN:   PT FREQUENCY: 1x/week   PT DURATION: 12 weeks   PLANNED INTERVENTIONS: Therapeutic exercises, Therapeutic activity, Neuromuscular re-education, Patient/Family education, Joint mobilization, Dry Needling, Electrical stimulation, Spinal mobilization, Cryotherapy, Moist heat, Taping, Ultrasound, Biofeedback, and Manual therapy   PLAN FOR NEXT SESSION:  dry needling  to right pelvic floor, work on  core and back strength, update HEP  Eulis Foster, PT 02/19/23 12:29 PM

## 2023-03-18 ENCOUNTER — Other Ambulatory Visit (HOSPITAL_COMMUNITY): Payer: Self-pay

## 2023-03-18 ENCOUNTER — Other Ambulatory Visit: Payer: Self-pay

## 2023-04-16 ENCOUNTER — Other Ambulatory Visit (HOSPITAL_COMMUNITY): Payer: Self-pay

## 2023-04-16 ENCOUNTER — Other Ambulatory Visit: Payer: Self-pay | Admitting: Obstetrics and Gynecology

## 2023-04-16 ENCOUNTER — Encounter: Payer: Self-pay | Admitting: Obstetrics and Gynecology

## 2023-04-16 DIAGNOSIS — N926 Irregular menstruation, unspecified: Secondary | ICD-10-CM

## 2023-04-16 DIAGNOSIS — N946 Dysmenorrhea, unspecified: Secondary | ICD-10-CM

## 2023-04-16 MED ORDER — LO LOESTRIN FE 1 MG-10 MCG / 10 MCG PO TABS
1.0000 | ORAL_TABLET | Freq: Every day | ORAL | 6 refills | Status: DC
  Filled 2023-04-16: qty 28, 28d supply, fill #0
  Filled 2023-05-10: qty 28, 28d supply, fill #1

## 2023-05-02 ENCOUNTER — Encounter: Payer: Self-pay | Admitting: Physical Therapy

## 2023-05-02 ENCOUNTER — Ambulatory Visit: Payer: 59 | Attending: Family Medicine | Admitting: Physical Therapy

## 2023-05-02 DIAGNOSIS — R279 Unspecified lack of coordination: Secondary | ICD-10-CM | POA: Diagnosis not present

## 2023-05-02 DIAGNOSIS — M6281 Muscle weakness (generalized): Secondary | ICD-10-CM | POA: Diagnosis not present

## 2023-05-02 DIAGNOSIS — R1031 Right lower quadrant pain: Secondary | ICD-10-CM | POA: Diagnosis not present

## 2023-05-02 NOTE — Therapy (Signed)
 OUTPATIENT PHYSICAL THERAPY TREATMENT NOTE   Patient Name: April Williams MRN: [id number] DOB:05/04/2000, 23 y.o., female 18 Date: 05/02/2023  PCP: Oris Camie BRAVO, NP  REFERRING PROVIDER:  Eldonna Suzen Octave, MD   END OF SESSION:   PT End of Session - 05/02/23 1021     Visit Number 15    Date for PT Re-Evaluation 06/21/23    Authorization Type [facility]    PT Start Time 1015    PT Stop Time 1055    PT Time Calculation (min) 40 min    Activity Tolerance Patient tolerated treatment well    Behavior During Therapy [facility] for tasks assessed/performed             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    Irritable bowel syndrome    Lump of right breast 06/30/2018   Nasal polyps    Obsessive-compulsive disorder    Physical deconditioning 04/21/2018   Plantar wart    POTS (postural orthostatic tachycardia syndrome)    Scoliosis of thoracolumbar spine    Vitamin D  deficiency    Past Surgical History:  Procedure Laterality Date   NASAL SINUS SURGERY  06/12/2017   wisdom tooth removal     Patient Active Problem List   Diagnosis Date Noted   Menorrhagia with regular cycle 02/11/2023   Family history of clotting disorder 10/28/2022   Iron deficiency 10/28/2022   Encounter for annual physical exam 03/21/2022   Ectopic atrial rhythm 06/13/2020   Irritable bowel syndrome    Obsessive compulsive disorder 06/15/2019   Persistent depressive disorder with melancholic features, currently moderate 06/15/2019   Adjustment disorder with mixed anxiety and depressed mood 07/28/2018    Dysautonomia (HCC) 09/02/2017   Adolescent idiopathic scoliosis of thoracolumbar region 08/04/2017   Delayed sleep phase syndrome 08/01/2017   POTS (postural orthostatic tachycardia syndrome) 06/30/2017   REFERRING DIAG: K59.04 (ICD-10-CM) - Chronic idiopathic constipation    THERAPY DIAG:  Muscle weakness (generalized)   Unspecified lack of coordination   Right lower quadrant abdominal pain   Rationale for Evaluation and Treatment: Rehabilitation   ONSET DATE: 2021   SUBJECTIVE:  SUBJECTIVE STATEMENT: I am having regular bowel movements and not taking medication for it.   PAIN:  Are you having pain? Yes NPRS scale: 0/10, 05/02/23 Pain location:  right lower abdominal   Pain type: aching and dull Pain description: intermittent    Aggravating factors: certain foods, eating, sitting on commode with feet on squatty potty increases rectal pain Relieving factors: occasional enemas  PAIN:  Are you having pain? Yes:12/30/22 NPRS scale: 0/10 Pain location: rectum Pain description: sharp and goes posteriorly and goes to the tailbone Aggravating factors: passing stool Relieving factors: relax her muscles      PRECAUTIONS: None   WEIGHT BEARING RESTRICTIONS: No   FALLS:  Has patient fallen in last 6 months? No   LIVING ENVIRONMENT: Lives with: lives with their family     OCCUPATION: student   PLOF: Independent   PATIENT GOALS: reduce pain    PERTINENT HISTORY:  Dysautonomia; IBS; POTS; Scoliosis of Thoracolumbar spine; OCD   BOWEL MOVEMENT: Pain with bowel movement: Yes Type of bowel movement:Type (Bristol Stool Scale) Type 3, 6, 7, Frequency weekly, Strain Yes, and Splinting no Fully empty rectum: Yes Leakage: No Fiber supplement: stool softener and dulcolax, senna - sees GI in  [place]    URINATION: Pain with urination: No Fully empty bladder: Yes:   Stream: Strong Urgency: No Frequency: average Leakage:  none     INTERCOURSE:no active   PREGNANCY: no pregnancies       PROLAPSE: None     OBJECTIVE:    DIAGNOSTIC FINDINGS:  Pelvic floor strength II/V  PVR of 144 ml was obtained by bladder scan  No evidence of urethral prolapse Hymenal ring is slightly more prominent      COGNITION: Overall cognitive status: Within functional limits for tasks assessed                          SENSATION: Light touch: Appears intact Proprioception: Appears intact     POSTURE:  scoliosis in thoracolumbar area     LUMBARAROM/PROM: full lumbar ROM     LOWER EXTREMITY ROM: Full bilateral hip ROM     LOWER EXTREMITY MMT:   MMT Right eval Left eval Right/left  08/02/22 Right/left 11/18/22 Right/left 02/19/23  Hip abduction 4/5 4/5 4/5 4/5 4/5    PALPATION:   General  decreased mobility of the lower rib cage, Tightness of diaphragm, tenderness located on the right lower quadrant, lower abdominal tissue has fascial restrictions.                  External Perineal Exam tenderness along the coccygeus, external anal sphincter                             Internal Pelvic Floor tenderness located on the right obturator internist, right iliococcygeus, external and internal anal sphincter; her pelvic floor muscles relax when she brings her knees to chest. Not able to push therapist finger out of the anal canal.    Patient confirms identification and approves PT to assess internal pelvic floor and treatment Yes   PELVIC MMT:   MMT eval 02/19/23  Internal Anal Sphincter 2/5 2/5  External Anal Sphincter 2/5 2/5  Puborectalis 2/5 2/5  (Blank rows = not tested)         TONE: increased     TODAY'S TREATMENT:  05/02/23 Exercises: Strengthening: Nustep level 5 for 6 minutes while assess patient Bilateral  shoulder extension with green band with tactile  cues to retract the shoulders 2 x 10 1/2 kneel pull diagonally to the forward knee 10 x each with green band  Holding band and walk sideways to engage the core 10 x each way Standing trunk rotation with green band 10 x each way Dead bug 10 x Bird dog 10 x    02/19/23 Exercises: Strengthening: Nustep level 2 for 5 minutes while assess patient, resistance was easy Leg press 80 # 10 x Side step to BOSU ball with chair in front due to balance Standing hip flexion with ball and wall between leg to work core and hip strength Outside foot on 4 inch step and pull green band to outside foot Bilateral shoulder extension with green band with tactile cues to retract the shoulders Squat with bilateral shoulder retraction with green band Alternate hip flexion isometric with core engagement    02/10/23 Manual: Soft tissue mobilization:  To assess for dry needling Manual work to the external anal sphincter and superior transverse perineum externally to elongate the tissue after dry needling.  Trigger Point Dry-Needling  Treatment instructions: Expect mild to moderate muscle soreness. S/S of pneumothorax if dry needled over a lung field, and to seek immediate medical attention should they occur. Patient verbalized understanding of these instructions and education.  Patient Consent Given: Yes Education handout provided: Yes Muscles treated: Right external anal sphincter , superior transverse perineum Electrical stimulation performed: No Parameters: N/A Treatment response/outcome: elongation of muscle and trigger point response Exercises: Strengthening: Diagonals holding 5# in sitting and tactile cues to engage the  core in tall kneel Walking with 10# kettle bell in one hand 20 feet each hand 4 times with tactile cues to bring shoulder down and cues for abdominals contracted Lay on side and hip abduction with pressing into ball with tactile cues to engage the core 10 x 2 each side Stand on foam  and bil. Lower trap with green band  Stand on foam and bilateral shoulder extension with green band Stand on foam and diagonals with green band PLANK wall pushups with keeping shoulder down with arms at sides for triceps then arms at shoulder for pectoralis        PATIENT EDUCATION:  05/02/23 Education details: Access Code: [email or web address];information on dry needling Person educated: Patient Education method: Explanation, Demonstration, Tactile cues, Verbal cues, Handouts, and you tube video Education comprehension: verbalized understanding, returned demonstration, verbal cues required, tactile cues required, and needs further education   HOME EXERCISE PROGRAM: 05/02/23 Access Code: [email or web address] URL: [email or web address] Date: 05/02/2023 Prepared by: Channing Pereyra  Program Notes sit on ball and massage pelvic floor  Exercises - Seated Diaphragmatic Breathing  - 2 x daily - 7 x weekly - 1 sets - 10 reps - Sidelying Thoracic Rotation with Open Book  - 1 x daily - 7 x weekly - 3 sets - 10 reps - Thoracic Sidebending with Towel Roll  - 1 x daily - 7 x weekly - 1 sets - 10 reps - Quadruped Hip Abduction with Resistance Loop  - 1 x daily - 3 x weekly - 1 sets - 10 reps - Shoulder Extension with Resistance  - 1 x daily - 3 x weekly - 2 sets - 10 reps - Half Kneeling Diagonal Chops with Resistance  - 1 x daily - 1 x weekly - 1 sets - 10 reps - Anti-Rotation Sidestepping with Resistance  - 1 x daily - 3 x weekly - 1 sets -  10 reps - Standing Trunk Rotation with Resistance  - 1 x daily - 3 x weekly - 1 sets - 10 reps - Supine Dead Bug with Leg Extension  - 1 x daily - 3 x weekly - 2 sets - 10 reps - Quadruped Pelvic Floor Contraction with Opposite Arm and Leg Lift  - 1 x daily - 3 x weekly - 1 sets - 10 reps   ASSESSMENT:   CLINICAL IMPRESSION: Patient is a 23 y.o. female who was seen today for physical therapy  treatment for constipation.  Patient is not having pain. She is not having  issues with bowel movements and not straining. She is feeling stronger doing the exercises. Patient is independent with her HEP. She has met all of her goals and ready for discharge.    OBJECTIVE IMPAIRMENTS: decreased activity tolerance, decreased endurance, decreased strength, increased fascial restrictions, increased muscle spasms, and pain.    ACTIVITY LIMITATIONS: toileting   PARTICIPATION LIMITATIONS: community activity   PERSONAL FACTORS: Age, Time since onset of injury/illness/exacerbation, and 3+ comorbidities: Dysautonomia; IBS; POTS; Scoliosis of Thoracolumbar spine; OCD  are also affecting patient's functional outcome.    REHAB POTENTIAL: Excellent   CLINICAL DECISION MAKING: Evolving/moderate complexity   EVALUATION COMPLEXITY: Moderate     GOALS: Goals reviewed with patient? Yes   SHORT TERM GOALS: Target date: 07/11/22   Patient is able to demonstrate correct toileting technique to have a bowel movement.  Baseline: still working on the breath work Goal status: Met 02/07/23   2.  Patient understands how to perform diaphragmatic breathing with expansion of her rib cage to relax her pelvic floor.  Baseline:  Goal status: Met 08/02/22   3.  Patient understands how to perform abdominal massage to assist with peristalic motion of the intestines.  Baseline:  Goal status: Met 08/02/22   4.  Patient reports her pain reduced >/= 25% due to reduction of trigger points and increased in muscle relaxation.  Baseline: decreased by 20% Goal status: Met 09/23/22    LONG TERM GOALS: Target date: 06/21/23   Patient is independent with advanced HEP to improve pelvic floor coordination for a bowel movement.  Baseline:  Goal status: Met 05/02/23   2.  Patient is able to contract the lower abdominals equally with the upper abdominals so she is able to generate correct force to push stool out.  Baseline: still needs verbal cues Goal status: Met 05/02/23   3.  Patient pelvic floor muscles  are relaxed  and reduced trigger points so her pain decreased >/= 75%.  Baseline:  Goal status: Met 05/02/23   4.  Patient is able to have a bowel movement 2 times per week due to improve intestinal mobility and relaxation of the pelvic floor.  Baseline: every other day with diarrhea, 1-2 times per week with solid stool Goal status: Met 05/02/23   5.  Patient is able to push the therapist finger out of the rectum due to improve coordination of her pelvic floor.  Baseline:  Goal status: Met due to the ability to have bowel movements       PLAN:Discharge to HEP this visit    Channing Pereyra, PT 05/02/23 10:54 AM    PHYSICAL THERAPY DISCHARGE SUMMARY  Visits from Start of Care: 15  Current functional level related to goals / functional outcomes: See above.    Remaining deficits: See above.    Education / Equipment: HEP   Patient agrees to discharge. Patient goals were met.  Patient is being discharged due to meeting the stated rehab goals. Thank you for the referral.   Channing Pereyra, PT 05/02/23 10:54 AM

## 2023-05-05 ENCOUNTER — Encounter: Payer: 59 | Admitting: Physical Therapy

## 2023-05-12 ENCOUNTER — Other Ambulatory Visit (HOSPITAL_COMMUNITY): Payer: Self-pay

## 2023-05-12 ENCOUNTER — Other Ambulatory Visit: Payer: Self-pay

## 2023-05-13 ENCOUNTER — Other Ambulatory Visit: Payer: Self-pay

## 2023-05-13 ENCOUNTER — Other Ambulatory Visit (HOSPITAL_COMMUNITY): Payer: Self-pay

## 2023-05-13 ENCOUNTER — Encounter (HOSPITAL_COMMUNITY): Payer: Self-pay

## 2023-05-15 ENCOUNTER — Other Ambulatory Visit: Payer: Self-pay

## 2023-05-16 ENCOUNTER — Encounter: Payer: 59 | Admitting: Physical Therapy

## 2023-05-16 ENCOUNTER — Other Ambulatory Visit (HOSPITAL_COMMUNITY): Payer: Self-pay

## 2023-05-19 ENCOUNTER — Encounter: Payer: 59 | Admitting: Physical Therapy

## 2023-05-23 ENCOUNTER — Other Ambulatory Visit (HOSPITAL_COMMUNITY): Payer: Self-pay

## 2023-05-27 ENCOUNTER — Emergency Department (HOSPITAL_BASED_OUTPATIENT_CLINIC_OR_DEPARTMENT_OTHER)

## 2023-05-27 ENCOUNTER — Emergency Department (HOSPITAL_BASED_OUTPATIENT_CLINIC_OR_DEPARTMENT_OTHER)
Admission: EM | Admit: 2023-05-27 | Discharge: 2023-05-27 | Disposition: A | Discharge status: Home / Self Care | Attending: Emergency Medicine | Admitting: Emergency Medicine

## 2023-05-27 ENCOUNTER — Other Ambulatory Visit: Payer: Self-pay

## 2023-05-27 ENCOUNTER — Encounter (HOSPITAL_BASED_OUTPATIENT_CLINIC_OR_DEPARTMENT_OTHER): Payer: Self-pay | Admitting: Emergency Medicine

## 2023-05-27 DIAGNOSIS — G901 Familial dysautonomia [Riley-Day]: Secondary | ICD-10-CM | POA: Insufficient documentation

## 2023-05-27 DIAGNOSIS — M79662 Pain in left lower leg: Secondary | ICD-10-CM | POA: Diagnosis not present

## 2023-05-27 DIAGNOSIS — R519 Headache, unspecified: Secondary | ICD-10-CM | POA: Insufficient documentation

## 2023-05-27 DIAGNOSIS — R200 Anesthesia of skin: Secondary | ICD-10-CM | POA: Diagnosis not present

## 2023-05-27 DIAGNOSIS — R202 Paresthesia of skin: Secondary | ICD-10-CM | POA: Insufficient documentation

## 2023-05-27 DIAGNOSIS — R29818 Other symptoms and signs involving the nervous system: Secondary | ICD-10-CM | POA: Diagnosis not present

## 2023-05-27 DIAGNOSIS — M79605 Pain in left leg: Secondary | ICD-10-CM | POA: Diagnosis not present

## 2023-05-27 DIAGNOSIS — M79602 Pain in left arm: Secondary | ICD-10-CM | POA: Insufficient documentation

## 2023-05-27 DIAGNOSIS — R600 Localized edema: Secondary | ICD-10-CM | POA: Diagnosis not present

## 2023-05-27 DIAGNOSIS — M79609 Pain in unspecified limb: Secondary | ICD-10-CM

## 2023-05-27 LAB — CBC
HCT: 43.3 % (ref 36.0–46.0)
Hemoglobin: 14.7 g/dL (ref 12.0–15.0)
MCH: 27.4 pg (ref 26.0–34.0)
MCHC: 33.9 g/dL (ref 30.0–36.0)
MCV: 80.8 fL (ref 80.0–100.0)
Platelets: 351 10*3/uL (ref 150–400)
RBC: 5.36 MIL/uL — ABNORMAL HIGH (ref 3.87–5.11)
RDW: 12.3 % (ref 11.5–15.5)
WBC: 10.7 10*3/uL — ABNORMAL HIGH (ref 4.0–10.5)
nRBC: 0 % (ref 0.0–0.2)

## 2023-05-27 LAB — URINALYSIS, ROUTINE W REFLEX MICROSCOPIC
Bilirubin Urine: NEGATIVE
Glucose, UA: NEGATIVE mg/dL
Hgb urine dipstick: NEGATIVE
Ketones, ur: NEGATIVE mg/dL
Leukocytes,Ua: NEGATIVE
Nitrite: NEGATIVE
Specific Gravity, Urine: 1.031 — ABNORMAL HIGH (ref 1.005–1.030)
pH: 5.5 (ref 5.0–8.0)

## 2023-05-27 LAB — BASIC METABOLIC PANEL
Anion gap: 11 (ref 5–15)
BUN: 11 mg/dL (ref 6–20)
CO2: 24 mmol/L (ref 22–32)
Calcium: 9.3 mg/dL (ref 8.9–10.3)
Chloride: 100 mmol/L (ref 98–111)
Creatinine, Ser: 0.49 mg/dL (ref 0.44–1.00)
GFR, Estimated: >60 mL/min (ref >60)
Glucose, Bld: 86 mg/dL (ref 70–99)
Potassium: 3.9 mmol/L (ref 3.5–5.1)
Sodium: 135 mmol/L (ref 135–145)

## 2023-05-27 LAB — MAGNESIUM: Magnesium: 1.9 mg/dL (ref 1.7–2.4)

## 2023-05-27 LAB — PREGNANCY, URINE: Preg Test, Ur: NEGATIVE

## 2023-05-27 MED ORDER — ACETAMINOPHEN 325 MG PO TABS
650.0000 mg | ORAL_TABLET | Freq: Once | ORAL | Status: AC
  Administered 2023-05-27: 650 mg via ORAL
  Filled 2023-05-27: qty 2

## 2023-05-27 MED ORDER — LIDOCAINE 5 % EX PTCH
1.0000 | MEDICATED_PATCH | CUTANEOUS | Status: DC
  Administered 2023-05-27: 1 via TRANSDERMAL
  Filled 2023-05-27 (×2): qty 1

## 2023-05-27 NOTE — Discharge Instructions (Addendum)
 As discussed, your labs and imaging are reassuring.  There is no signs of blood clots causing your symptoms.  You can get lidocaine patches over-the-counter to use as needed for neck/arm pain.  They are usually not covered by insurance but they are the same strength regardless.  You can alternate between ibuprofen and Tylenol every 4 hours as needed for pain or headaches.  Additionally, I have sent ambulatory referral for neurology to establish care for your dysautonomia.  Get help right away if: You feel weak or have new weakness in an arm or leg. You have trouble walking or moving. You have problems speaking, understanding, or seeing. You feel confused. You cannot control when you pee (urinate) or poop (have a bowel movement).

## 2023-05-27 NOTE — ED Triage Notes (Signed)
 C/o headache, left sided numbness in left arm and leg. States pain in left calf and left biceps. Recently started [facility] last month. States she is "worried about a blood clot"   LKN [id number]

## 2023-05-27 NOTE — ED Provider Notes (Incomplete)
 [place] [facility] DEPARTMENT AT [facility] Provider Note   CSN: [id number] Arrival date & time: 05/27/23  1116     History {Add pertinent medical, surgical, social history, OB history to HPI:1} Chief Complaint  Patient presents with  . Arm Pain    April Williams is a 23 y.o. female with a history of POTS who presents the ED today for numbness.  Reports woke up with numbness, tingling, and pain to her left upper extremity and left calf as well as a headache.  She recently started oral birth control pills and is concerned she may have a blood clot.  No associated shortness of breath or chest pain.  States that she does have some left-sided neck tenderness.  Denies weakness or loss of sensation to extremities.    Home Medications Prior to Admission medications   Medication Sig Start Date End Date Taking? Authorizing Provider  ARIPiprazole (ABILIFY) 2 MG tablet Take 1 tablet (2 mg total) by mouth daily. 02/10/23   Mozingo, Thereasa Solo, NP  cholecalciferol (VITAMIN D) 1000 units tablet Take 4,000 Units by mouth daily.    [provider]  ferrous sulfate 324 (65 Fe) MG TBEC Take 1 tablet by mouth in the morning and 1 tablet in the evening with meals. If causes constipation, begin miralax daily as needed. Check Hgb/ferritin in 3 months.. 06/27/21     fludrocortisone (FLORINEF) 0.1 MG tablet Take 0.2 mg by mouth daily.    [provider]  LO LOESTRIN FE 1 MG-10 MCG / 10 MCG tablet Take 1 tablet by mouth daily. 04/16/23   Lorriane Shire, MD  Magnesium Hydroxide 1200 MG CHEW Chew 1,200 mg by mouth daily.    [provider]  Melatonin 10 MG TABS Take by mouth.    [provider]  metoprolol tartrate (LOPRESSOR) 25 MG tablet TAKE 1 TABLET BY MOUTH ONCE A DAY 05/22/20 05/22/21    midodrine (PROAMATINE) 5 MG tablet Take 2 tablets (10 mg total) by mouth 3 (three) times daily. 11/19/22     senna (SENOKOT) 8.6 MG tablet Take 2 tablets by mouth daily.     [provider]  sertraline (ZOLOFT) 100 MG tablet Take 2 tablets (200 mg total) by mouth daily. 02/10/23   Mozingo, Thereasa Solo, NP  traZODone (DESYREL) 50 MG tablet Take 1 tablet (50 mg total) by mouth at bedtime. 02/10/23   Mozingo, Thereasa Solo, NP      Allergies    Patient has no known allergies.    Review of Systems   Review of Systems  Neurological:  Positive for numbness.  All other systems reviewed and are negative.   Physical Exam Updated Vital Signs BP 122/76 (BP Location: Left Arm)   Pulse (!) 102   Temp 98.7 F (37.1 C) (Oral)   Resp 18   SpO2 99%  Physical Exam Vitals and nursing note reviewed.  Constitutional:      General: She is not in acute distress.    Appearance: Normal appearance.  HENT:     Head: Normocephalic and atraumatic.     Mouth/Throat:     Mouth: Mucous membranes are moist.  Eyes:     Conjunctiva/sclera: Conjunctivae normal.     Pupils: Pupils are equal, round, and reactive to light.  Neck:     Comments: Tenderness to palpation of the left paravertebral muscles without midline tenderness Cardiovascular:     Rate and Rhythm: Normal rate and regular rhythm.     Pulses: Normal pulses.  Heart sounds: Normal heart sounds.  Pulmonary:     Effort: Pulmonary effort is normal.     Breath sounds: Normal breath sounds.  Abdominal:     Palpations: Abdomen is soft.     Tenderness: There is no abdominal tenderness.  Musculoskeletal:        General: Tenderness present. No deformity or signs of injury. Normal range of motion.     Cervical back: Normal range of motion. Tenderness present.     Right lower leg: No edema.     Left lower leg: No edema.     Comments: Tenderness to palpation of the posterior left calf without lower extremity edema. Tenderness to palpation of left anterior shoulder. ROM, sensation, and strength intact bilaterally.  Skin:    General: Skin is warm and dry.     Findings: No rash.  Neurological:      General: No focal deficit present.     Mental Status: She is alert.     Sensory: No sensory deficit.     Motor: No weakness.  Psychiatric:        Mood and Affect: Mood normal.        Behavior: Behavior normal.    ED Results / Procedures / Treatments   Labs (all labs ordered are listed, but only abnormal results are displayed) Labs Reviewed  CBC  BASIC METABOLIC PANEL  MAGNESIUM    EKG None  Radiology US Venous Img Upper Uni Left Result Date: 05/27/2023 CLINICAL DATA:  Left upper extremity pain. EXAM: LEFT UPPER EXTREMITY VENOUS DOPPLER ULTRASOUND TECHNIQUE: Gray-scale sonography with graded compression, as well as color Doppler and duplex ultrasound were performed to evaluate the upper extremity deep venous system from the level of the subclavian vein and including the jugular, axillary, basilic, radial, ulnar and upper cephalic vein. Spectral Doppler was utilized to evaluate flow at rest and with distal augmentation maneuvers. COMPARISON:  None Available. FINDINGS: Contralateral Subclavian Vein: Respiratory phasicity is normal and symmetric with the symptomatic side. No evidence of thrombus. Normal compressibility. Internal Jugular Vein: No evidence of thrombus. Normal compressibility, respiratory phasicity and response to augmentation. Subclavian Vein: No evidence of thrombus. Normal compressibility, respiratory phasicity and response to augmentation. Axillary Vein: No evidence of thrombus. Normal compressibility, respiratory phasicity and response to augmentation. Cephalic Vein: No evidence of thrombus. Normal compressibility, respiratory phasicity and response to augmentation. Basilic Vein: No evidence of thrombus. Normal compressibility, respiratory phasicity and response to augmentation. Brachial Veins: No evidence of thrombus. Normal compressibility, respiratory phasicity and response to augmentation. Radial Veins: No evidence of thrombus. Normal compressibility, respiratory phasicity  and response to augmentation. Ulnar Veins: No evidence of thrombus. Normal compressibility, respiratory phasicity and response to augmentation. Venous Reflux:  None visualized. Other Findings: No evidence of superficial thrombophlebitis or abnormal fluid collection. IMPRESSION: No evidence of DVT within the left upper extremity. Electronically Signed   By: Irish Lack M.D.   On: 05/27/2023 13:05   US Venous Img Lower  Left (DVT Study) Result Date: 05/27/2023 CLINICAL DATA:  Left lower extremity pain and edema. EXAM: LEFT LOWER EXTREMITY VENOUS DOPPLER ULTRASOUND TECHNIQUE: Gray-scale sonography with graded compression, as well as color Doppler and duplex ultrasound were performed to evaluate the lower extremity deep venous systems from the level of the common femoral vein and including the common femoral, femoral, profunda femoral, popliteal and calf veins including the posterior tibial, peroneal and gastrocnemius veins when visible. The superficial great saphenous vein was also interrogated. Spectral Doppler was utilized to evaluate  flow at rest and with distal augmentation maneuvers in the common femoral, femoral and popliteal veins. COMPARISON:  None Available. FINDINGS: Contralateral Common Femoral Vein: Respiratory phasicity is normal and symmetric with the symptomatic side. No evidence of thrombus. Normal compressibility. Common Femoral Vein: No evidence of thrombus. Normal compressibility, respiratory phasicity and response to augmentation. Saphenofemoral Junction: No evidence of thrombus. Normal compressibility and flow on color Doppler imaging. Profunda Femoral Vein: No evidence of thrombus. Normal compressibility and flow on color Doppler imaging. Femoral Vein: No evidence of thrombus. Normal compressibility, respiratory phasicity and response to augmentation. Popliteal Vein: No evidence of thrombus. Normal compressibility, respiratory phasicity and response to augmentation. Calf Veins: No evidence  of thrombus. Normal compressibility and flow on color Doppler imaging. Superficial Great Saphenous Vein: No evidence of thrombus. Normal compressibility. Venous Reflux:  None. Other Findings: No evidence of superficial thrombophlebitis or abnormal fluid collection. IMPRESSION: No evidence of left lower extremity deep venous thrombosis. Electronically Signed   By: Irish Lack M.D.   On: 05/27/2023 13:04   CT Head Wo Contrast Result Date: 05/27/2023 CLINICAL DATA:  Neuro deficit, acute, stroke suspected. Mental status changes of unknown cause. Headache. Left arm and leg numbness. EXAM: CT HEAD WITHOUT CONTRAST TECHNIQUE: Contiguous axial images were obtained from the base of the skull through the vertex without intravenous contrast. RADIATION DOSE REDUCTION: This exam was performed according to the departmental dose-optimization program which includes automated exposure control, adjustment of the mA and/or kV according to patient size and/or use of iterative reconstruction technique. COMPARISON:  07/04/2017 FINDINGS: Brain: The brain shows a normal appearance without evidence of malformation, atrophy, old or acute small or large vessel infarction, mass lesion, hemorrhage, hydrocephalus or extra-axial collection. No finding to suggest demyelinating disease. Vascular: No hyperdense vessel. No evidence of atherosclerotic calcification. Skull: Normal.  No traumatic finding.  No focal bone lesion. Sinuses/Orbits: Sinuses are clear. Orbits appear normal. Mastoids are clear. Other: None significant IMPRESSION: Normal head CT. Electronically Signed   By: Paulina Fusi M.D.   On: 05/27/2023 12:30    Procedures Procedures: not indicated. {Document cardiac monitor, telemetry assessment procedure when appropriate:1}  Medications Ordered in ED Medications - No data to display  ED Course/ Medical Decision Making/ A&P   {   Click here for ABCD2, HEART and other calculatorsREFRESH Note before signing :1}                               Medical Decision Making Amount and/or Complexity of Data Reviewed Labs: ordered.  Risk OTC drugs. Prescription drug management.   This patient presents to the ED for concern of ***, this involves an extensive number of treatment options, and is a complaint that carries with it a high risk of complications and morbidity.   Differential diagnosis includes: ***   Comorbidities  See HPI above   Additional History  Additional history obtained from prior records   Cardiac Monitoring / EKG  The patient was maintained on a cardiac monitor.  I personally viewed and interpreted the cardiac monitored which showed: *** with a heart rate of *** bpm.   Lab Tests  I ordered and personally interpreted labs.  The pertinent results include:  ***   Imaging Studies  I ordered imaging studies including venous US left upper and lower extremities, CT head  I independently visualized and interpreted imaging which showed:  No evidence of DVT in left upper or lower extremity Normal  head CT I agree with the radiologist interpretation   Problem List / ED Course / Critical Interventions / Medication Management  *** I ordered medications including: *** for ***  Reevaluation of the patient after these medicines showed that the patient {resolved/improved/worsened:23923::"improved"} I have reviewed the patients home medicines and have made adjustments as needed   Social Determinants of Health  ***   Test / Admission - Considered  ***   {Document critical care time when appropriate:1} {Document review of labs and clinical decision tools ie heart score, Chads2Vasc2 etc:1}  {Document your independent review of radiology images, and any outside records:1} {Document your discussion with family members, caretakers, and with consultants:1} {Document social determinants of health affecting pt's care:1} {Document your decision making why or why not admission, treatments  were needed:1} Final Clinical Impression(s) / ED Diagnoses Final diagnoses:  None    Rx / DC Orders ED Discharge Orders     None

## 2023-05-27 NOTE — ED Provider Triage Note (Signed)
 Emergency Medicine Provider Triage Evaluation Note  Cristela Felt , a 23 y.o. female  was evaluated in triage.  Pt complains of pain in left leg and left arm with some paresthesias.  Review of Systems  Positive: Pain and paresthesias Negative: Chest pain, shortness of breath, fever, chills, weakness  Physical Exam  BP 122/76 (BP Location: Left Arm)   Pulse (!) 102   Temp 98.7 F (37.1 C) (Oral)   Resp 18   SpO2 99%  Gen:   Awake, no distress   Resp:  Normal effort  MSK:   Moves extremities without difficulty  Other:  Palpated both arm and leg with no swelling, redness, or other discoloration noted  Medical Decision Making  Medically screening exam initiated at 11:50 AM.  Appropriate orders placed.  Consuella Lose Juste was informed that the remainder of the evaluation will be completed by another provider, this initial triage assessment does not replace that evaluation, and the importance of remaining in the ED until their evaluation is complete.  Will place orders for Doppler of the leg and arm, labs, CT head   Margarita Grizzle, MD 05/27/23 1151

## 2023-05-27 NOTE — ED Provider Notes (Signed)
 [facility]ERGENCY DEPARTMENT AT [facility] Provider Note   CSN: [id number] Arrival date & time: 05/27/23  1116     History  Chief Complaint  Patient presents with   Arm Pain    April Williams is a 23 y.o. female with a history of POTS who presents the ED today for numbness.  Reports woke up with numbness, tingling, and pain to her left upper extremity and left calf as well as a headache.  She recently started oral birth control pills and is concerned she may have a blood clot.  No associated shortness of breath or chest pain.  States that she does have some left-sided neck tenderness.  Denies weakness or loss of sensation to extremities.    Home Medications Prior to Admission medications   Medication Sig Start Date End Date Taking? Authorizing Provider  ARIPiprazole (ABILIFY) 2 MG tablet Take 1 tablet (2 mg total) by mouth daily. 02/10/23   Mozingo, Thereasa Solo, NP  cholecalciferol (VITAMIN D) 1000 units tablet Take 4,000 Units by mouth daily.    [provider]  ferrous sulfate 324 (65 Fe) MG TBEC Take 1 tablet by mouth in the morning and 1 tablet in the evening with meals. If causes constipation, begin miralax daily as needed. Check Hgb/ferritin in 3 months.. 06/27/21     fludrocortisone (FLORINEF) 0.1 MG tablet Take 0.2 mg by mouth daily.    [provider]  LO LOESTRIN FE 1 MG-10 MCG / 10 MCG tablet Take 1 tablet by mouth daily. 04/16/23   Lorriane Shire, MD  Magnesium Hydroxide 1200 MG CHEW Chew 1,200 mg by mouth daily.    [provider]  Melatonin 10 MG TABS Take by mouth.    [provider]  metoprolol tartrate (LOPRESSOR) 25 MG tablet TAKE 1 TABLET BY MOUTH ONCE A DAY 05/22/20 05/22/21    midodrine (PROAMATINE) 5 MG tablet Take 2 tablets (10 mg total) by mouth 3 (three) times daily. 11/19/22     senna (SENOKOT) 8.6 MG tablet Take 2 tablets by mouth daily.    [provider]  sertraline (ZOLOFT) 100 MG tablet Take 2  tablets (200 mg total) by mouth daily. 02/10/23   Mozingo, Thereasa Solo, NP  traZODone (DESYREL) 50 MG tablet Take 1 tablet (50 mg total) by mouth at bedtime. 02/10/23   Mozingo, Thereasa Solo, NP      Allergies    Patient has no known allergies.    Review of Systems   Review of Systems  Neurological:  Positive for numbness.  All other systems reviewed and are negative.   Physical Exam Updated Vital Signs BP 123/69   Pulse 76   Temp 98.5 F (36.9 C) (Oral)   Resp 16   SpO2 99%  Physical Exam Vitals and nursing note reviewed.  Constitutional:      General: She is not in acute distress.    Appearance: Normal appearance.  HENT:     Head: Normocephalic and atraumatic.     Mouth/Throat:     Mouth: Mucous membranes are moist.  Eyes:     Conjunctiva/sclera: Conjunctivae normal.     Pupils: Pupils are equal, round, and reactive to light.  Neck:     Comments: Tenderness to palpation of the left paravertebral muscles without midline tenderness Cardiovascular:     Rate and Rhythm: Normal rate and regular rhythm.     Pulses: Normal pulses.     Heart sounds: Normal heart sounds.  Pulmonary:  Effort: Pulmonary effort is normal.     Breath sounds: Normal breath sounds.  Abdominal:     Palpations: Abdomen is soft.     Tenderness: There is no abdominal tenderness.  Musculoskeletal:        General: Tenderness present. No deformity or signs of injury. Normal range of motion.     Cervical back: Normal range of motion. Tenderness present.     Right lower leg: No edema.     Left lower leg: No edema.     Comments: Tenderness to palpation of the posterior left calf without lower extremity edema. Tenderness to palpation of left anterior shoulder. ROM, sensation, and strength intact bilaterally.  Skin:    General: Skin is warm and dry.     Findings: No rash.  Neurological:     General: No focal deficit present.     Mental Status: She is alert.     Sensory: No sensory deficit.      Motor: No weakness.  Psychiatric:        Mood and Affect: Mood normal.        Behavior: Behavior normal.    ED Results / Procedures / Treatments   Labs (all labs ordered are listed, but only abnormal results are displayed) Labs Reviewed  CBC - Abnormal; Notable for the following components:      Result Value   WBC 10.7 (*)    RBC 5.36 (*)    All other components within normal limits  URINALYSIS, ROUTINE W REFLEX MICROSCOPIC - Abnormal; Notable for the following components:   APPearance HAZY (*)    Specific Gravity, Urine 1.031 (*)    Protein, ur TRACE (*)    Bacteria, UA MANY (*)    All other components within normal limits  BASIC METABOLIC PANEL  PREGNANCY, URINE  MAGNESIUM    EKG None  Radiology US Venous Img Upper Uni Left Result Date: 05/27/2023 CLINICAL DATA:  Left upper extremity pain. EXAM: LEFT UPPER EXTREMITY VENOUS DOPPLER ULTRASOUND TECHNIQUE: Gray-scale sonography with graded compression, as well as color Doppler and duplex ultrasound were performed to evaluate the upper extremity deep venous system from the level of the subclavian vein and including the jugular, axillary, basilic, radial, ulnar and upper cephalic vein. Spectral Doppler was utilized to evaluate flow at rest and with distal augmentation maneuvers. COMPARISON:  None Available. FINDINGS: Contralateral Subclavian Vein: Respiratory phasicity is normal and symmetric with the symptomatic side. No evidence of thrombus. Normal compressibility. Internal Jugular Vein: No evidence of thrombus. Normal compressibility, respiratory phasicity and response to augmentation. Subclavian Vein: No evidence of thrombus. Normal compressibility, respiratory phasicity and response to augmentation. Axillary Vein: No evidence of thrombus. Normal compressibility, respiratory phasicity and response to augmentation. Cephalic Vein: No evidence of thrombus. Normal compressibility, respiratory phasicity and response to augmentation.  Basilic Vein: No evidence of thrombus. Normal compressibility, respiratory phasicity and response to augmentation. Brachial Veins: No evidence of thrombus. Normal compressibility, respiratory phasicity and response to augmentation. Radial Veins: No evidence of thrombus. Normal compressibility, respiratory phasicity and response to augmentation. Ulnar Veins: No evidence of thrombus. Normal compressibility, respiratory phasicity and response to augmentation. Venous Reflux:  None visualized. Other Findings: No evidence of superficial thrombophlebitis or abnormal fluid collection. IMPRESSION: No evidence of DVT within the left upper extremity. Electronically Signed   By: Irish Lack M.D.   On: 05/27/2023 13:05   US Venous Img Lower  Left (DVT Study) Result Date: 05/27/2023 CLINICAL DATA:  Left lower extremity pain and edema.  EXAM: LEFT LOWER EXTREMITY VENOUS DOPPLER ULTRASOUND TECHNIQUE: Gray-scale sonography with graded compression, as well as color Doppler and duplex ultrasound were performed to evaluate the lower extremity deep venous systems from the level of the common femoral vein and including the common femoral, femoral, profunda femoral, popliteal and calf veins including the posterior tibial, peroneal and gastrocnemius veins when visible. The superficial great saphenous vein was also interrogated. Spectral Doppler was utilized to evaluate flow at rest and with distal augmentation maneuvers in the common femoral, femoral and popliteal veins. COMPARISON:  None Available. FINDINGS: Contralateral Common Femoral Vein: Respiratory phasicity is normal and symmetric with the symptomatic side. No evidence of thrombus. Normal compressibility. Common Femoral Vein: No evidence of thrombus. Normal compressibility, respiratory phasicity and response to augmentation. Saphenofemoral Junction: No evidence of thrombus. Normal compressibility and flow on color Doppler imaging. Profunda Femoral Vein: No evidence of  thrombus. Normal compressibility and flow on color Doppler imaging. Femoral Vein: No evidence of thrombus. Normal compressibility, respiratory phasicity and response to augmentation. Popliteal Vein: No evidence of thrombus. Normal compressibility, respiratory phasicity and response to augmentation. Calf Veins: No evidence of thrombus. Normal compressibility and flow on color Doppler imaging. Superficial Great Saphenous Vein: No evidence of thrombus. Normal compressibility. Venous Reflux:  None. Other Findings: No evidence of superficial thrombophlebitis or abnormal fluid collection. IMPRESSION: No evidence of left lower extremity deep venous thrombosis. Electronically Signed   By: Irish Lack M.D.   On: 05/27/2023 13:04   CT Head Wo Contrast Result Date: 05/27/2023 CLINICAL DATA:  Neuro deficit, acute, stroke suspected. Mental status changes of unknown cause. Headache. Left arm and leg numbness. EXAM: CT HEAD WITHOUT CONTRAST TECHNIQUE: Contiguous axial images were obtained from the base of the skull through the vertex without intravenous contrast. RADIATION DOSE REDUCTION: This exam was performed according to the departmental dose-optimization program which includes automated exposure control, adjustment of the mA and/or kV according to patient size and/or use of iterative reconstruction technique. COMPARISON:  07/04/2017 FINDINGS: Brain: The brain shows a normal appearance without evidence of malformation, atrophy, old or acute small or large vessel infarction, mass lesion, hemorrhage, hydrocephalus or extra-axial collection. No finding to suggest demyelinating disease. Vascular: No hyperdense vessel. No evidence of atherosclerotic calcification. Skull: Normal.  No traumatic finding.  No focal bone lesion. Sinuses/Orbits: Sinuses are clear. Orbits appear normal. Mastoids are clear. Other: None significant IMPRESSION: Normal head CT. Electronically Signed   By: Paulina Fusi M.D.   On: 05/27/2023 12:30     Procedures Procedures: not indicated.   Medications Ordered in ED Medications  acetaminophen (TYLENOL) tablet 650 mg (650 mg Oral Given 05/27/23 1836)    ED Course/ Medical Decision Making/ A&P                                 Medical Decision Making Amount and/or Complexity of Data Reviewed Labs: ordered.  Risk OTC drugs. Prescription drug management.   This patient presents to the ED for concern of numbness to left upper and lower extremity, this involves an extensive number of treatment options, and is a complaint that carries with it a high risk of complications and morbidity.   Differential diagnosis includes: Stroke, DVT, radiculopathy, electrolyte derangement, dehydration, neuropathy, anxiety, etc.   Comorbidities  See HPI above   Additional History  Additional history obtained from prior records   Lab Tests  I ordered and personally interpreted labs.  The pertinent results  include:   BMP, magnesium, and CBC are within normal limits - no acute electrolyte derangement, AKI, infection, or anemia   Imaging Studies  I ordered imaging studies including venous US left upper and lower extremities, CT head  I independently visualized and interpreted imaging which showed:  No evidence of DVT in left upper or lower extremity Normal head CT I agree with the radiologist interpretation   Problem List / ED Course / Critical Interventions / Medication Management  Patient woke up this morning with headache, numbness/tingling, and pain to left shoulder and left calf.  Has never expected the legs before.  Also notes left paravertebral muscle tenderness.  Think she might have a muscle spasm in the neck.  Is concerned about DVT since she recently started oral contraceptives. I ordered medications including: Tylenol for ache Reevaluation of the patient after these medicines showed that the patient improved I have reviewed the patients home medicines and have made  adjustments as needed   Social Determinants of Health  Physical activity   Test / Admission - Considered  Discussed findings with patient and mom at bedside.  All questions were answered. Patient is stable and safe for discharge home. Return precautions given.       Final Clinical Impression(s) / ED Diagnoses Final diagnoses:  Dysautonomia (HCC)  Paresthesia and pain of extremity    Rx / DC Orders ED Discharge Orders          Ordered    Ambulatory referral to Neurology       Comments: An appointment is requested in approximately: 2 weeks   05/27/23 1829              Maxwell Marion, PA-C 05/28/23 0028    Ernie Avena, MD 05/31/23 [phone]

## 2023-05-28 ENCOUNTER — Telehealth (INDEPENDENT_AMBULATORY_CARE_PROVIDER_SITE_OTHER): Payer: Self-pay | Admitting: Pediatrics

## 2023-05-28 NOTE — Telephone Encounter (Signed)
  Name of who is calling: sharon   Caller's Relationship to Patient: mother  Best contact number: [phone]  Provider they see: Artis Flock   Reason for call: wanting to schedule with wolfe if she is able to still see her if not she would like a recommendation from [facility] where to refer her to mom would like a call back.     PRESCRIPTION REFILL ONLY  Name of prescription:  Pharmacy:

## 2023-05-28 NOTE — Telephone Encounter (Signed)
 Contacted patients mother.   Mom stated that she is having left sided tingling and numbness and headaches on the left side of her body. Mom stated that April Williams was in the ED all day due to this and they recommended that she follow up with a neurologist.   Mom stated if she is unable to see Dr. Artis Flock, she would like a referral to adult neuro.   I informed mom that I would send this message to the provider. Mom verbalized understanding of this.   SS, CCMA

## 2023-05-30 ENCOUNTER — Encounter: Payer: Self-pay | Admitting: Neurology

## 2023-05-30 ENCOUNTER — Encounter: Payer: 59 | Admitting: Physical Therapy

## 2023-05-30 DIAGNOSIS — E611 Iron deficiency: Secondary | ICD-10-CM | POA: Diagnosis not present

## 2023-05-30 DIAGNOSIS — R5382 Chronic fatigue, unspecified: Secondary | ICD-10-CM | POA: Diagnosis not present

## 2023-05-30 DIAGNOSIS — G901 Familial dysautonomia [Riley-Day]: Secondary | ICD-10-CM | POA: Diagnosis not present

## 2023-05-30 NOTE — Telephone Encounter (Signed)
 It looks like patient was referrad to [facility] neurology.  I agree with that this practice is great if she can get in.  It looks like the referral is still pending, I recommend mother call their office to see what can be done to speed up the referral.   Lorenz Coaster MD MPH

## 2023-05-30 NOTE — Telephone Encounter (Signed)
 Contacted patients mother.  Verified patients name and DOB as well as mothers name.   I relayed the previous message to mom, mom verbalized understanding of this.   SS, CCMA

## 2023-06-02 ENCOUNTER — Encounter: Payer: 59 | Admitting: Physical Therapy

## 2023-06-10 NOTE — Progress Notes (Unsigned)
 GYNECOLOGY VIRTUAL VISIT ENCOUNTER NOTE  Provider location: [facility] {[place]:[id number]::"[facility]"}   Patient location: Home  I connected with April Williams on 06/10/23 at 11:15 AM EDT by MyChart Video Encounter and verified that I am speaking with the correct person using two identifiers.   I discussed the limitations, risks, security and privacy concerns of performing an evaluation and management service virtually and the availability of in person appointments. I also discussed with the patient that there may be a patient responsible charge related to this service. The patient expressed understanding and agreed to proceed.   History:  April Williams is a 23 y.o. G0P0000 female being evaluated today for ***. She denies any abnormal vaginal discharge, bleeding, pelvic pain or other concerns.       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    Irritable bowel syndrome    Lump of right breast 06/30/2018   Nasal polyps    Obsessive-compulsive disorder    Physical deconditioning 04/21/2018   Plantar wart    POTS (postural orthostatic tachycardia syndrome)    Scoliosis of thoracolumbar spine    Vitamin D deficiency    Past Surgical History:  Procedure Laterality Date   NASAL SINUS SURGERY  06/12/2017   wisdom tooth removal     The following portions of the patient's history were reviewed and updated as appropriate: allergies, current medications, past  family history, past medical history, past social history, past surgical history and problem list.   Health Maintenance:  Normal pap and negative HRHPV on ***.  Normal mammogram on ***.   Review of Systems:  Pertinent items noted in HPI and remainder of comprehensive ROS otherwise negative.  Physical Exam:   General:  Alert, oriented and cooperative. Patient appears to be in no acute distress.  Mental Status: Normal mood and affect. Normal behavior. Normal judgment and thought content.   Respiratory: Normal respiratory effort, no problems with respiration noted  Rest of physical exam deferred due to type of encounter  Labs and Imaging No results found for this or any previous visit (from the past 2 weeks). US Venous Img Upper Uni Left Result Date: 05/27/2023 CLINICAL DATA:  Left upper extremity pain. EXAM: LEFT UPPER EXTREMITY VENOUS DOPPLER ULTRASOUND TECHNIQUE: Gray-scale sonography with graded compression, as well as color Doppler and duplex ultrasound were performed to evaluate the upper extremity deep venous system from the level of the subclavian vein and including the jugular, axillary, basilic, radial, ulnar and upper cephalic vein. Spectral Doppler was utilized to evaluate flow at rest and with distal augmentation maneuvers. COMPARISON:  None Available. FINDINGS: Contralateral Subclavian Vein: Respiratory phasicity is normal and symmetric with the symptomatic side. No evidence of thrombus. Normal compressibility. Internal Jugular Vein: No evidence of thrombus. Normal compressibility, respiratory phasicity and response to augmentation. Subclavian Vein: No evidence of thrombus. Normal compressibility, respiratory phasicity and response to augmentation. Axillary Vein: No evidence of thrombus. Normal compressibility, respiratory phasicity and response to augmentation. Cephalic Vein: No evidence of thrombus. Normal compressibility, respiratory  phasicity and response to augmentation. Basilic Vein:  No evidence of thrombus. Normal compressibility, respiratory phasicity and response to augmentation. Brachial Veins: No evidence of thrombus. Normal compressibility, respiratory phasicity and response to augmentation. Radial Veins: No evidence of thrombus. Normal compressibility, respiratory phasicity and response to augmentation. Ulnar Veins: No evidence of thrombus. Normal compressibility, respiratory phasicity and response to augmentation. Venous Reflux:  None visualized. Other Findings: No evidence of superficial thrombophlebitis or abnormal fluid collection. IMPRESSION: No evidence of DVT within the left upper extremity. Electronically Signed   By: Irish Lack M.D.   On: 05/27/2023 13:05   US Venous Img Lower  Left (DVT Study) Result Date: 05/27/2023 CLINICAL DATA:  Left lower extremity pain and edema. EXAM: LEFT LOWER EXTREMITY VENOUS DOPPLER ULTRASOUND TECHNIQUE: Gray-scale sonography with graded compression, as well as color Doppler and duplex ultrasound were performed to evaluate the lower extremity deep venous systems from the level of the common femoral vein and including the common femoral, femoral, profunda femoral, popliteal and calf veins including the posterior tibial, peroneal and gastrocnemius veins when visible. The superficial great saphenous vein was also interrogated. Spectral Doppler was utilized to evaluate flow at rest and with distal augmentation maneuvers in the common femoral, femoral and popliteal veins. COMPARISON:  None Available. FINDINGS: Contralateral Common Femoral Vein: Respiratory phasicity is normal and symmetric with the symptomatic side. No evidence of thrombus. Normal compressibility. Common Femoral Vein: No evidence of thrombus. Normal compressibility, respiratory phasicity and response to augmentation. Saphenofemoral Junction: No evidence of thrombus. Normal compressibility and flow on color Doppler imaging. Profunda Femoral Vein: No evidence of thrombus. Normal  compressibility and flow on color Doppler imaging. Femoral Vein: No evidence of thrombus. Normal compressibility, respiratory phasicity and response to augmentation. Popliteal Vein: No evidence of thrombus. Normal compressibility, respiratory phasicity and response to augmentation. Calf Veins: No evidence of thrombus. Normal compressibility and flow on color Doppler imaging. Superficial Great Saphenous Vein: No evidence of thrombus. Normal compressibility. Venous Reflux:  None. Other Findings: No evidence of superficial thrombophlebitis or abnormal fluid collection. IMPRESSION: No evidence of left lower extremity deep venous thrombosis. Electronically Signed   By: Irish Lack M.D.   On: 05/27/2023 13:04   CT Head Wo Contrast Result Date: 05/27/2023 CLINICAL DATA:  Neuro deficit, acute, stroke suspected. Mental status changes of unknown cause. Headache. Left arm and leg numbness. EXAM: CT HEAD WITHOUT CONTRAST TECHNIQUE: Contiguous axial images were obtained from the base of the skull through the vertex without intravenous contrast. RADIATION DOSE REDUCTION: This exam was performed according to the departmental dose-optimization program which includes automated exposure control, adjustment of the mA and/or kV according to patient size and/or use of iterative reconstruction technique. COMPARISON:  07/04/2017 FINDINGS: Brain: The brain shows a normal appearance without evidence of malformation, atrophy, old or acute small or large vessel infarction, mass lesion, hemorrhage, hydrocephalus or extra-axial collection. No finding to suggest demyelinating disease. Vascular: No hyperdense vessel. No evidence of atherosclerotic calcification. Skull: Normal.  No traumatic finding.  No focal bone lesion. Sinuses/Orbits: Sinuses are clear. Orbits appear normal. Mastoids are clear. Other: None significant IMPRESSION: Normal head CT. Electronically Signed   By: Paulina Fusi M.D.   On: 05/27/2023 12:30       Assessment and  Plan:     There are no diagnoses linked to this encounter.      I discussed the assessment and treatment plan with the patient. The patient was provided an opportunity to ask questions and all were answered.  The patient agreed with the plan and demonstrated an understanding of the instructions.   The patient was advised to call back or seek an in-person evaluation/go to the ED if the symptoms worsen or if the condition fails to improve as anticipated.  I provided *** minutes of face-to-face time during this encounter. I also spent *** minutes dedicated to the care of this patient including pre-visit review of records, post visit ordering of medications and appropriate tests or procedures, coordinating care and documenting this visit encounter.    Lorriane Shire, MD [facility] [employer]

## 2023-06-11 ENCOUNTER — Other Ambulatory Visit: Payer: Self-pay

## 2023-06-11 ENCOUNTER — Other Ambulatory Visit (HOSPITAL_COMMUNITY): Payer: Self-pay

## 2023-06-11 ENCOUNTER — Telehealth (INDEPENDENT_AMBULATORY_CARE_PROVIDER_SITE_OTHER): Payer: 59 | Admitting: Obstetrics and Gynecology

## 2023-06-11 DIAGNOSIS — N946 Dysmenorrhea, unspecified: Secondary | ICD-10-CM | POA: Diagnosis not present

## 2023-06-11 DIAGNOSIS — N926 Irregular menstruation, unspecified: Secondary | ICD-10-CM | POA: Diagnosis not present

## 2023-06-11 MED ORDER — LO LOESTRIN FE 1 MG-10 MCG / 10 MCG PO TABS
1.0000 | ORAL_TABLET | Freq: Every day | ORAL | 4 refills | Status: DC
  Filled 2023-06-11: qty 84, 84d supply, fill #0
  Filled 2023-08-06 – 2023-09-14 (×2): qty 84, 84d supply, fill #1
  Filled 2023-12-10: qty 84, 84d supply, fill #2
  Filled 2024-03-03: qty 84, 84d supply, fill #3

## 2023-06-20 ENCOUNTER — Other Ambulatory Visit (HOSPITAL_COMMUNITY): Payer: Self-pay

## 2023-06-23 ENCOUNTER — Ambulatory Visit (INDEPENDENT_AMBULATORY_CARE_PROVIDER_SITE_OTHER): Admitting: Adult Health

## 2023-06-23 ENCOUNTER — Encounter: Payer: Self-pay | Admitting: Adult Health

## 2023-06-23 DIAGNOSIS — F419 Anxiety disorder, unspecified: Secondary | ICD-10-CM | POA: Diagnosis not present

## 2023-06-23 DIAGNOSIS — F411 Generalized anxiety disorder: Secondary | ICD-10-CM

## 2023-06-23 DIAGNOSIS — F422 Mixed obsessional thoughts and acts: Secondary | ICD-10-CM

## 2023-06-23 DIAGNOSIS — F341 Dysthymic disorder: Secondary | ICD-10-CM

## 2023-06-23 NOTE — Progress Notes (Signed)
 April Williams [id number] 10-05-2000 23 y.o.  Virtual Visit via Telephone Note  I connected with pt on 06/23/23 at  3:30 PM EDT by telephone and verified that I am speaking with the correct person using two identifiers.   I discussed the limitations, risks, security and privacy concerns of performing an evaluation and management service by telephone and the availability of in person appointments. I also discussed with the patient that there may be a patient responsible charge related to this service. The patient expressed understanding and agreed to proceed.   I discussed the assessment and treatment plan with the patient. The patient was provided an opportunity to ask questions and all were answered. The patient agreed with the plan and demonstrated an understanding of the instructions.   The patient was advised to call back or seek an in-person evaluation if the symptoms worsen or if the condition fails to improve as anticipated.  I provided 21 minutes of non-face-to-face time during this encounter.  The patient was located at home.  The provider was located at [facility].   Dorothyann Gibbs, NP   Subjective:   Patient ID:  April Williams is a 23 y.o. (DOB 2000-06-21) female.  Chief Complaint: No chief complaint on file.   HPI April Williams presents for follow-up of persistent depressive disorder with melancholic features, currently moderate, mixed obsessional thoughts and acts, and avoidant-restrictive food intake disorder (ARFID).  Describes mood today as "ok". Pleasant. Reports tearfulness. Mood symptoms - reports depression, anxiety, and irritability. Reports varying interest and motivation. Denies panic attacks. Reports worry, rumination and over thinking. Reports intrusive thoughts. Mood is lower. Stating "overall, I feel like my thoughts keep eating away at me". Reports stopping the Abilify a month ago and restarted it backs 2 weeks ago - unable to see any  benefit.  Taking medications as prescribed.  Energy levels lower. Has a regular exercise routine. Enjoys some usual interests and activities. Single. Dating. Lives with mother. Sister lives 1.5 hours away - Father and brother lives in [place]. Spending time with family. Appetite adequate. Weight stable. Reports sleeping difficulties - mostly getting broken sleep. Focus and concentration stable. Completing some household tasks. Managing aspects of household. Taking classes at a community college - [employer].  Denies SI or HI.  Denies AH or VH. Denies self harm. Denies substance use.  Previous medication trials: Pristiq   Review of Systems:  Review of Systems  Musculoskeletal:  Negative for gait problem.  Neurological:  Negative for tremors.  Psychiatric/Behavioral:         Please refer to HPI    Medications: I have reviewed the patient's current medications.  Current Outpatient Medications  Medication Sig Dispense Refill   ARIPiprazole (ABILIFY) 2 MG tablet Take 1 tablet (2 mg total) by mouth daily. 90 tablet 3   cholecalciferol (VITAMIN D) 1000 units tablet Take 4,000 Units by mouth daily.     ferrous sulfate 324 (65 Fe) MG TBEC Take 1 tablet by mouth in the morning and 1 tablet in the evening with meals. If causes constipation, begin miralax daily as needed. Check Hgb/ferritin in 3 months.. 90 tablet 3   fludrocortisone (FLORINEF) 0.1 MG tablet Take 0.2 mg by mouth daily.     LO LOESTRIN FE 1 MG-10 MCG / 10 MCG tablet Take 1 tablet by mouth daily. 84 tablet 4   Magnesium Hydroxide 1200 MG CHEW Chew 1,200 mg by mouth daily.     Melatonin 10 MG TABS Take by  mouth.     metoprolol tartrate (LOPRESSOR) 25 MG tablet TAKE 1 TABLET BY MOUTH ONCE A DAY 90 tablet 2   midodrine (PROAMATINE) 5 MG tablet Take 2 tablets (10 mg total) by mouth 3 (three) times daily. 540 tablet 3   senna (SENOKOT) 8.6 MG tablet Take 2 tablets by mouth daily.     sertraline (ZOLOFT) 100 MG tablet Take 2 tablets (200  mg total) by mouth daily. 180 tablet 3   traZODone (DESYREL) 50 MG tablet Take 1 tablet (50 mg total) by mouth at bedtime. 90 tablet 3   No current facility-administered medications for this visit.    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    Irritable bowel syndrome    Lump of right breast 06/30/2018   Nasal polyps    Obsessive-compulsive disorder    Physical deconditioning 04/21/2018   Plantar wart    POTS (postural orthostatic tachycardia syndrome)    Scoliosis of thoracolumbar spine    Vitamin D deficiency     Family History  Problem Relation Age of Onset   Depression Mother    OCD Mother    Obesity Mother    Asthma Brother    Stroke Maternal Uncle    Depression Maternal Grandmother    Diabetes Paternal Grandmother    Seizures Other    Migraines Neg Hx    Anxiety disorder Neg Hx    Bipolar disorder Neg Hx    Schizophrenia Neg Hx    ADD / ADHD Neg Hx    Autism Neg Hx     Social History   Socioeconomic History   Marital status: Single    Spouse name: Not on file   Number of children: Not on file   Years of education: Not on file   Highest education level: 9th grade  Occupational History   Occupation: student  Tobacco Use   Smoking status: Never    Passive exposure: Never   Smokeless tobacco: Never  Vaping Use   Vaping status: Never Used  Substance and Sexual Activity   Alcohol use: Never   Drug use: Never   Sexual activity: Never  Other Topics Concern   Not on file  Social History Narrative    April Williams is at [employer] for dual enrollment with an associates in Retail buyer. She would like to be an Transport planner.       She lives with mother and goes to her father's house every other weekend. She has a Cat and a Hedgehog   Has been primarily with mother since Covid 19 restrictions.    She enjoys playing video games, sing, and sew.          06/15/2019: Self-directed in schoolwork generally A and B grades currently taking AP psychology planning considering herself undeserving for low grade or delayed assignment completion which is always B or better than average.  Patient wishes to have dual educational program next year in 11th grade with classes also at [employer]. She has been inflexible and constricted in repertoire  of nutrition and activity, though she can spontaneously enjoy social activity with friends as recent as a year ago as well as volunteering for special needs children to have Friday night activities before COVID.  Otherwise she has maintained only 1 friend though she enjoys the cat at home though having to clean after it.  Patient feels tormented by older sister who has a 29-month-old baby patient loves as with father or psychologist wanting her to gain weight to become healthy.   Social Drivers of Corporate investment banker Strain: Low Risk  (02/03/2023)   Overall Financial Resource Strain (CARDIA)    Difficulty of Paying Living Expenses: Not very hard  Food Insecurity: No Food Insecurity (02/03/2023)   Hunger Vital Sign    Worried About Running Out of Food in the Last Year: Never true    Ran Out of Food in the Last Year: Never true  Transportation Needs: No Transportation Needs (02/03/2023)   PRAPARE - Administrator, Civil Service (Medical): No    Lack of Transportation (Non-Medical): No  Physical Activity: Insufficiently Active (02/03/2023)   Exercise Vital Sign    Days of Exercise per Week: 1 day    Minutes of Exercise per Session: 20 min  Stress: Stress Concern  Present (02/03/2023)   [employer] of Occupational Health - Occupational Stress Questionnaire    Feeling of Stress : Rather much  Social Connections: Socially Isolated (02/03/2023)   Social Connection and Isolation Panel [NHANES]    Frequency of Communication with Friends and Family: More than three times a week    Frequency of Social Gatherings with Friends and Family: Never    Attends Religious Services: Never    Database administrator or Organizations: No    Attends Banker Meetings: Never    Marital Status: Never married  Intimate Partner Violence: Not At Risk (02/03/2023)   Humiliation, Afraid, Rape, and Kick questionnaire    Fear of Current or Ex-Partner: No    Emotionally Abused: No    Physically Abused: No    Sexually Abused: No    Past Medical History, Surgical history, Social history, and Family history were reviewed and updated as appropriate.   Please see review of systems for further details on the patient's review from today.   Objective:   Physical Exam:  There were no vitals taken for this visit.  Physical Exam Constitutional:      General: She is not in acute distress. Musculoskeletal:        General: No deformity.  Neurological:     Mental Status: She is alert and oriented to person, place, and time.     Coordination: Coordination normal.  Psychiatric:        Attention and Perception: Attention and perception normal. She does not perceive auditory or visual hallucinations.        Mood and Affect: Mood normal. Mood is not anxious or depressed. Affect is not labile, blunt, angry or inappropriate.        Speech: Speech normal.        Behavior: Behavior normal.        Thought Content: Thought content normal. Thought content is not paranoid or delusional. Thought content does not include homicidal or suicidal ideation. Thought content does not include homicidal or suicidal plan.        Cognition and Memory: Cognition and memory normal.         Judgment: Judgment normal.     Comments: Insight  intact     Lab Review:     Component Value Date/Time   NA 135 05/27/2023 1630   K 3.9 05/27/2023 1630   CL 100 05/27/2023 1630   CO2 24 05/27/2023 1630   GLUCOSE 86 05/27/2023 1630   BUN 11 05/27/2023 1630   CREATININE 0.49 05/27/2023 1630   CREATININE 0.70 01/27/2019 1015   CALCIUM 9.3 05/27/2023 1630   PROT 7.2 01/27/2019 1015   ALBUMIN 4.1 12/07/2016 1402   AST 13 01/27/2019 1015   ALT 10 01/27/2019 1015   ALKPHOS 117 12/07/2016 1402   BILITOT 0.7 01/27/2019 1015   GFRNONAA >60 05/27/2023 1630   GFRAA NOT CALCULATED 12/07/2016 1402       Component Value Date/Time   WBC 10.7 (H) 05/27/2023 1630   RBC 5.36 (H) 05/27/2023 1630   HGB 14.7 05/27/2023 1630   HGB 15.6 (H) 10/28/2022 1236   HCT 43.3 05/27/2023 1630   PLT 351 05/27/2023 1630   PLT 456 (H) 10/28/2022 1236   MCV 80.8 05/27/2023 1630   MCH 27.4 05/27/2023 1630   MCHC 33.9 05/27/2023 1630   RDW 12.3 05/27/2023 1630   LYMPHSABS 1.9 10/28/2022 1236   MONOABS 0.7 10/28/2022 1236   EOSABS 0.1 10/28/2022 1236   BASOSABS 0.0 10/28/2022 1236    No results found for: "POCLITH", "LITHIUM"   No results found for: "PHENYTOIN", "PHENOBARB", "VALPROATE", "CBMZ"   .res Assessment: Plan:    Plan:  PDMP reviewed  1. Zoloft 200mg  daily 2. Abilify 2mg  daily - restarted 2 weeks ago 3. Trazadone 25mg  at hs   Consider Vraylar   RTC 4 weeks  20 minutes spent dedicated to the care of this patient on the date of this encounter to include pre-visit review of records, ordering of medication, post visit documentation, and face-to-face time with the patient discussing depression, anxiety and obsessional thoughts. Discussed continuing current medication regimen.  Discussed potential metabolic side effects associated with atypical antipsychotics, as well as potential risk for movement side effects. Advised pt to contact office if movement side effects occur.    There are  no diagnoses linked to this encounter.  Please see After Visit Summary for patient specific instructions.  Future Appointments  Date Time Provider Department Center  06/23/2023  3:30 PM Lazlo Tunney, Thereasa Solo, NP [facility] None  10/16/2023  9:10 AM Drema Dallas, DO [facility] None  02/10/2024  9:15 AM Early, Sung Amabile, NP [facility]    No orders of the defined types were placed in this encounter.     -------------------------------

## 2023-07-16 DIAGNOSIS — R0789 Other chest pain: Secondary | ICD-10-CM | POA: Diagnosis not present

## 2023-07-16 DIAGNOSIS — R55 Syncope and collapse: Secondary | ICD-10-CM | POA: Diagnosis not present

## 2023-07-16 DIAGNOSIS — Q676 Pectus excavatum: Secondary | ICD-10-CM | POA: Diagnosis not present

## 2023-07-16 DIAGNOSIS — I491 Atrial premature depolarization: Secondary | ICD-10-CM | POA: Diagnosis not present

## 2023-07-16 DIAGNOSIS — G901 Familial dysautonomia [Riley-Day]: Secondary | ICD-10-CM | POA: Diagnosis not present

## 2023-08-05 ENCOUNTER — Telehealth: Admitting: Adult Health

## 2023-08-06 ENCOUNTER — Other Ambulatory Visit: Payer: Self-pay | Admitting: Nurse Practitioner

## 2023-08-06 ENCOUNTER — Other Ambulatory Visit (HOSPITAL_COMMUNITY): Payer: Self-pay

## 2023-08-07 ENCOUNTER — Other Ambulatory Visit: Payer: Self-pay

## 2023-08-07 ENCOUNTER — Other Ambulatory Visit: Payer: Self-pay | Admitting: Nurse Practitioner

## 2023-08-07 ENCOUNTER — Other Ambulatory Visit (HOSPITAL_COMMUNITY): Payer: Self-pay

## 2023-08-07 MED ORDER — METOPROLOL TARTRATE 25 MG PO TABS
25.0000 mg | ORAL_TABLET | Freq: Every day | ORAL | 2 refills | Status: AC
  Filled 2023-08-07: qty 90, 90d supply, fill #0
  Filled 2024-03-03: qty 90, 90d supply, fill #1

## 2023-08-08 ENCOUNTER — Other Ambulatory Visit (HOSPITAL_COMMUNITY): Payer: Self-pay

## 2023-08-08 ENCOUNTER — Other Ambulatory Visit: Payer: Self-pay | Admitting: Medical

## 2023-08-08 MED ORDER — FLUDROCORTISONE ACETATE 0.1 MG PO TABS
0.2000 mg | ORAL_TABLET | Freq: Every day | ORAL | 0 refills | Status: DC
  Filled 2023-08-08: qty 90, 45d supply, fill #0

## 2023-08-08 MED ORDER — FLUDROCORTISONE ACETATE 0.1 MG PO TABS
0.2000 mg | ORAL_TABLET | Freq: Every day | ORAL | 0 refills | Status: DC
  Filled 2023-08-08: qty 180, 90d supply, fill #0

## 2023-08-11 ENCOUNTER — Encounter: Payer: Self-pay | Admitting: Adult Health

## 2023-08-11 ENCOUNTER — Telehealth: Admitting: Adult Health

## 2023-08-11 DIAGNOSIS — F5082 Avoidant/restrictive food intake disorder: Secondary | ICD-10-CM

## 2023-08-11 DIAGNOSIS — F341 Dysthymic disorder: Secondary | ICD-10-CM

## 2023-08-11 DIAGNOSIS — F411 Generalized anxiety disorder: Secondary | ICD-10-CM | POA: Diagnosis not present

## 2023-08-11 DIAGNOSIS — F422 Mixed obsessional thoughts and acts: Secondary | ICD-10-CM | POA: Diagnosis not present

## 2023-08-11 NOTE — Addendum Note (Signed)
 Addended by: Reagan Camera on: 08/11/2023 01:57 PM   Modules accepted: Level of Service

## 2023-08-11 NOTE — Progress Notes (Addendum)
 April Williams [id number] 2000/08/29 23 y.o.  Virtual Visit via Video Note  I connected with pt @ on 08/11/23 at  1:30 PM EDT by a video enabled telemedicine application and verified that I am speaking with the correct person using two identifiers.   I discussed the limitations of evaluation and management by telemedicine and the availability of in person appointments. The patient expressed understanding and agreed to proceed.  I discussed the assessment and treatment plan with the patient. The patient was provided an opportunity to ask questions and all were answered. The patient agreed with the plan and demonstrated an understanding of the instructions.   The patient was advised to call back or seek an in-person evaluation if the symptoms worsen or if the condition fails to improve as anticipated.  I provided 25 minutes of non-face-to-face time during this encounter.  The patient was located at home.  The provider was located at [facility].   April Camera, NP   Subjective:   Patient ID:  April Williams is a 23 y.o. (DOB 27-Oct-2000) female.  Chief Complaint: No chief complaint on file.   HPI April Williams presents for follow-up of persistent depressive disorder with melancholic features, currently moderate, mixed obsessional thoughts and acts, and avoidant-restrictive food intake disorder (ARFID).  Describes mood today as "ok". Pleasant. Reports tearfulness at times. Mood symptoms - reports decreased depression, anxiety and irritability - "more situational". Reports varying interest and motivation. Denies panic attacks. Denies worry and rumination. Reports over thinking. Denies intrusive thoughts. Reports mood is variable. Stating "overall, I think I'm doing pretty good". Feels like current medication regimen is helpful. Taking medications as prescribed.  Energy levels improved. Active, does not have a regular exercise routine. Enjoys some usual interests and  activities. Single. Lives with mother. Sister lives 1.5 hours away - father and brother lives in [place] . Spending time with family. Appetite adequate. Weight stable. Reports sleeping better at night. Averages 6 hours.  Focus and concentration stable. Completing some household tasks. Managing aspects of household. Plans to take 2 online classes at a community college - [employer].  Denies SI or HI.  Denies AH or VH. Denies self harm. Denies substance use.  Previous medication trials: Pristiq    Review of Systems:  Review of Systems  Musculoskeletal:  Negative for gait problem.  Neurological:  Negative for tremors.  Psychiatric/Behavioral:         Please refer to HPI    Medications: I have reviewed the patient's current medications.  Current Outpatient Medications  Medication Sig Dispense Refill   ARIPiprazole  (ABILIFY ) 2 MG tablet Take 1 tablet (2 mg total) by mouth daily. 90 tablet 3   cholecalciferol (VITAMIN D ) 1000 units tablet Take 4,000 Units by mouth daily.     ferrous sulfate  324 (65 Fe) MG TBEC Take 1 tablet by mouth in the morning and 1 tablet in the evening with meals. If causes constipation, begin miralax daily as needed. Check Hgb/ferritin in 3 months.. 90 tablet 3   fludrocortisone  (FLORINEF ) 0.1 MG tablet Take 2 tablets (0.2 mg total) by mouth daily. 180 tablet 0   LO LOESTRIN FE  1 MG-10 MCG / 10 MCG tablet Take 1 tablet by mouth daily. 84 tablet 4   Magnesium Hydroxide 1200 MG CHEW Chew 1,200 mg by mouth daily.     Melatonin 10 MG TABS Take by mouth.     metoprolol  tartrate (LOPRESSOR ) 25 MG tablet Take 1 tablet (25 mg total) by mouth daily. 90  tablet 2   midodrine  (PROAMATINE ) 5 MG tablet Take 2 tablets (10 mg total) by mouth 3 (three) times daily. 540 tablet 3   senna (SENOKOT) 8.6 MG tablet Take 2 tablets by mouth daily.     sertraline  (ZOLOFT ) 100 MG tablet Take 2 tablets (200 mg total) by mouth daily. 180 tablet 3   traZODone  (DESYREL ) 50 MG tablet Take 1 tablet (50  mg total) by mouth at bedtime. 90 tablet 3   No current facility-administered medications for this visit.    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    Irritable bowel syndrome    Lump of right breast 06/30/2018   Nasal polyps    Obsessive-compulsive disorder    Physical deconditioning 04/21/2018   Plantar wart    POTS (postural orthostatic tachycardia syndrome)    Scoliosis of thoracolumbar spine    Vitamin D  deficiency     Family History  Problem Relation Age of Onset   Depression Mother    OCD Mother    Obesity Mother    Asthma Brother    Stroke Maternal Uncle    Depression Maternal Grandmother    Diabetes Paternal Grandmother    Seizures Other    Migraines Neg Hx    Anxiety disorder Neg Hx    Bipolar disorder Neg Hx    Schizophrenia Neg Hx    ADD / ADHD Neg Hx    Autism Neg Hx     Social History   Socioeconomic History   Marital status: Single    Spouse name: Not on file   Number of children: Not on file   Years of education: Not on file   Highest education level: 9th grade  Occupational History   Occupation: student  Tobacco Use   Smoking status: Never    Passive exposure: Never   Smokeless tobacco: Never  Vaping Use   Vaping status: Never Used  Substance and Sexual Activity   Alcohol use: Never   Drug use: Never   Sexual activity: Never  Other Topics Concern   Not on file  Social History Narrative   April Williams is at [employer] for dual enrollment with an associates in Retail buyer. She would like to be an  Transport planner.       She lives with mother and goes to her father's house every other weekend. She has a Cat and a Hedgehog   Has been primarily with mother since Covid 19 restrictions.    She enjoys playing video games, sing, and sew.          06/15/2019: Self-directed in schoolwork generally A and B grades currently taking AP psychology planning considering herself undeserving for low grade or delayed assignment completion which is always B or better than average.  Patient wishes to have dual educational program next year in 11th grade with classes also at [employer]. She has been inflexible and constricted in repertoire of nutrition and activity, though she can spontaneously enjoy social activity with friends as recent as a year ago as  well as volunteering for special needs children to have Friday night activities before COVID.  Otherwise she has maintained only 1 friend though she enjoys the cat at home though having to clean after it.  Patient feels tormented by older sister who has a 45-month-old baby patient loves as with father or psychologist wanting her to gain weight to become healthy.   Social Drivers of Corporate investment banker Strain: Low Risk  (02/03/2023)   Overall Financial Resource Strain (CARDIA)    Difficulty of Paying Living Expenses: Not very hard  Food Insecurity: No Food Insecurity (02/03/2023)   Hunger Vital Sign    Worried About Running Out of Food in the Last Year: Never true    Ran Out of Food in the Last Year: Never true  Transportation Needs: No Transportation Needs (02/03/2023)   PRAPARE - Administrator, Civil Service (Medical): No    Lack of Transportation (Non-Medical): No  Physical Activity: Insufficiently Active (02/03/2023)   Exercise Vital Sign    Days of Exercise per Week: 1 day    Minutes of Exercise per Session: 20 min  Stress: Stress Concern Present (02/03/2023)   [employer] of Occupational Health - Occupational Stress Questionnaire     Feeling of Stress : Rather much  Social Connections: Socially Isolated (02/03/2023)   Social Connection and Isolation Panel [NHANES]    Frequency of Communication with Friends and Family: More than three times a week    Frequency of Social Gatherings with Friends and Family: Never    Attends Religious Services: Never    Database administrator or Organizations: No    Attends Banker Meetings: Never    Marital Status: Never married  Intimate Partner Violence: Not At Risk (02/03/2023)   Humiliation, Afraid, Rape, and Kick questionnaire    Fear of Current or Ex-Partner: No    Emotionally Abused: No    Physically Abused: No    Sexually Abused: No    Past Medical History, Surgical history, Social history, and Family history were reviewed and updated as appropriate.   Please see review of systems for further details on the patient's review from today.   Objective:   Physical Exam:  There were no vitals taken for this visit.  Physical Exam Constitutional:      General: She is not in acute distress. Musculoskeletal:        General: No deformity.  Neurological:     Mental Status: She is alert and oriented to person, place, and time.     Coordination: Coordination normal.  Psychiatric:        Attention and Perception: Attention and perception normal. She does not perceive auditory or visual hallucinations.        Mood and Affect: Affect is not labile, blunt, angry or inappropriate.        Speech: Speech normal.        Behavior: Behavior normal.        Thought Content: Thought content normal. Thought content is not paranoid or delusional. Thought content does not include homicidal or suicidal ideation. Thought content does not include homicidal or suicidal plan.        Cognition and Memory: Cognition and memory normal.        Judgment: Judgment normal.     Comments: Insight intact     Lab Review:     Component Value Date/Time   NA 135 05/27/2023 1630   K 3.9  05/27/2023 1630  CL 100 05/27/2023 1630   CO2 24 05/27/2023 1630   GLUCOSE 86 05/27/2023 1630   BUN 11 05/27/2023 1630   CREATININE 0.49 05/27/2023 1630   CREATININE 0.70 01/27/2019 1015   CALCIUM 9.3 05/27/2023 1630   PROT 7.2 01/27/2019 1015   ALBUMIN 4.1 12/07/2016 1402   AST 13 01/27/2019 1015   ALT 10 01/27/2019 1015   ALKPHOS 117 12/07/2016 1402   BILITOT 0.7 01/27/2019 1015   GFRNONAA >60 05/27/2023 1630   GFRAA NOT CALCULATED 12/07/2016 1402       Component Value Date/Time   WBC 10.7 (H) 05/27/2023 1630   RBC 5.36 (H) 05/27/2023 1630   HGB 14.7 05/27/2023 1630   HGB 15.6 (H) 10/28/2022 1236   HCT 43.3 05/27/2023 1630   PLT 351 05/27/2023 1630   PLT 456 (H) 10/28/2022 1236   MCV 80.8 05/27/2023 1630   MCH 27.4 05/27/2023 1630   MCHC 33.9 05/27/2023 1630   RDW 12.3 05/27/2023 1630   LYMPHSABS 1.9 10/28/2022 1236   MONOABS 0.7 10/28/2022 1236   EOSABS 0.1 10/28/2022 1236   BASOSABS 0.0 10/28/2022 1236    No results found for: "POCLITH", "LITHIUM"   No results found for: "PHENYTOIN", "PHENOBARB", "VALPROATE", "CBMZ"   .res Assessment: Plan:    Plan:  PDMP reviewed  1. Zoloft  200mg  daily 2. Abilify  2mg  daily  3. Trazadone 25mg  - 2 at hs   Consider Vraylar   RTC 4 weeks  25 minutes spent dedicated to the care of this patient on the date of this encounter to include pre-visit review of records, ordering of medication, post visit documentation, and face-to-face time with the patient discussing depression, anxiety and obsessional thoughts. Discussed continuing current medication regimen.  Discussed potential metabolic side effects associated with atypical antipsychotics, as well as potential risk for movement side effects. Advised pt to contact office if movement side effects occur.    Diagnoses and all orders for this visit:  Persistent depressive disorder with melancholic features, currently moderate  Mixed obsessional thoughts and acts  Generalized  anxiety disorder  Avoidant-restrictive food intake disorder (ARFID)     Please see After Visit Summary for patient specific instructions.  Future Appointments  Date Time Provider Department Center  09/15/2023  1:30 PM Sereen Schaff Nattalie, NP CP-CP None  10/16/2023  9:10 AM Merriam Abbey, DO [facility] None  02/10/2024  9:15 AM Early, Adriane Albe, NP P[facility]    No orders of the defined types were placed in this encounter.     -------------------------------

## 2023-09-13 NOTE — Progress Notes (Unsigned)
 Cardiology Office Note:    Date:  09/16/2023   ID:  Cailie E Taitano, DOB 02-28-2001, MRN [id number]  PCP:  Oris Camie BRAVO, NP   [facility] Providers Cardiologist:  None     Referring MD: Prosnitz, Beverley [place],*   Chief Complaint  Patient presents with   POTS    History of Present Illness:    Mary E Broers is a 23 y.o. female seen at the request of Camie Oris for evaluation of POTS. She has previously been followed by Dr Beverley Aldo with pediatric cardiology in [place]. She has a history of dysautonomia. Diagnosed about 6 years ago. Patient reports seeing multiple specialists at [place], [facility] and [place]. This has been managed with avoidance measures, as well as Florinef  and midodrine . Also on metoprolol  for atrial tachycardia.  Symptoms are multiple including paresthesias, chest pain, tachycardia with HR up to 180. Symptoms started after bout of flu and Strep. Was fairly bedbound for a couple of years. Notes heat intolerance, sweating, color changes in skin, full body jolts, HA, decreased appetite, dizziness. Only passed out once in the beginning. Has been consider for connective tissue disorder but doesn't fit for EDS. She has done PT on more than one occasion.  Currently states her symptoms are the best they have been. She is doing Network engineer at [employer]. She is trying to get out more but still gets tired easily with walking and if she does much may feel wiped out for a couple of days. Is planning to see Neuro here as well- Dr Skeet. Has history of pectus excavatum.   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   cholecalciferol (VITAMIN D ) 1000 units tablet Take 4,000 Units by mouth daily.   fludrocortisone  (FLORINEF ) 0.1 MG tablet Take 2 tablets (0.2 mg total) by mouth daily.   LO LOESTRIN FE  1 MG-10 MCG / 10 MCG tablet Take 1 tablet by mouth daily.   Melatonin 10 MG TABS Take by mouth.   metoprolol  tartrate (LOPRESSOR ) 25 MG tablet Take 1 tablet (25 mg total) by mouth daily.   midodrine  (PROAMATINE ) 5 MG tablet Take 2 tablets (10 mg total) by mouth 3 (three) times daily.   senna (SENOKOT) 8.6 MG tablet Take 2 tablets by mouth daily.   sertraline  (ZOLOFT ) 100 MG tablet Take 2 tablets (200 mg total) by mouth daily.   traZODone  (DESYREL ) 50 MG tablet Take 1 tablet (50 mg total) by mouth at bedtime.  Allergies:   Patient has no known allergies.   Social History   Socioeconomic History   Marital status: Single    Spouse name: Not on file   Number of children: Not on file   Years of education: Not on file   Highest education level: 9th grade  Occupational History   Occupation: student  Tobacco Use   Smoking status: Never    Passive exposure: Never   Smokeless tobacco: Never  Vaping Use   Vaping status: Never Used  Substance and Sexual Activity   Alcohol use: Never   Drug use: Never   Sexual activity: Never  Other Topics Concern   Not on file  Social History Narrative   Khadijatou is at [employer] for dual enrollment with an associates  in Retail buyer. She would like to be an Transport planner.       She lives with mother and goes to her father's house every other weekend. She has a Cat and a Hedgehog   Has been primarily with mother since Covid 19 restrictions.    She enjoys playing video games, sing, and sew.          06/15/2019: Self-directed in schoolwork generally A and B grades currently taking AP psychology planning considering herself undeserving for low grade or delayed assignment completion which is always B or better than average.  Patient wishes to have dual educational program next year in 11th grade with classes also at [employer]. She has been inflexible and constricted in repertoire of nutrition and activity, though she can spontaneously enjoy social activity with friends as recent as a year ago as well as volunteering for special needs children to have Friday night activities before COVID.  Otherwise she has maintained only 1 friend though she enjoys the cat at home though having to clean after it.  Patient feels tormented by older sister who has a 108-month-old baby patient loves as with father or psychologist wanting her to gain weight to become healthy.   Social Drivers of Corporate investment banker Strain: Low Risk  (02/03/2023)   Overall Financial Resource Strain (CARDIA)    Difficulty of Paying Living Expenses: Not very hard  Food Insecurity: No Food Insecurity (02/03/2023)   Hunger Vital Sign    Worried About Running Out of Food in the Last Year: Never true    Ran Out of Food in the Last Year: Never true  Transportation Needs: No Transportation Needs (02/03/2023)   PRAPARE - Administrator, Civil Service (Medical): No    Lack of Transportation (Non-Medical): No  Physical Activity: Insufficiently Active (02/03/2023)   Exercise Vital Sign    Days of Exercise per Week: 1 day    Minutes of Exercise per Session: 20 min  Stress: Stress Concern Present (02/03/2023)   [employer] of Occupational Health  - Occupational Stress Questionnaire    Feeling of Stress : Rather much  Social Connections: Socially Isolated (02/03/2023)   Social Connection and Isolation Panel    Frequency of Communication with Friends and Family: More than three times a week    Frequency of Social Gatherings with Friends and Family: Never    Attends Religious Services: Never    Database administrator or Organizations: No    Attends Engineer, structural: Never    Marital Status: Never married     Family History: The patient's family history includes Asthma in her brother; Depression in her maternal grandmother and mother; Diabetes in her paternal grandmother;  OCD in her mother; Obesity in her mother; Seizures in an other family member; Stroke in her maternal uncle. There is no history of Migraines, Anxiety disorder, Bipolar disorder, Schizophrenia, ADD / ADHD, or Autism.  ROS:   Please see the history of present illness.     All other systems reviewed and are negative.  EKGs/Labs/Other Studies Reviewed:    The following studies were reviewed today: CEM 03-03-2024 - 02/15/2020): 11d17h of data available for review. There were 25 stable events, 11 auto triggers for sinus tachycardia and two episodes of asymptomatic short VT runs (4 and 7 beats), slow at ~150 bpm. Patient triggers included shortness of breath, chest pain, rapid heart beat, flutter beats, and fatigue. These symptoms correlated with sinus rhythm and sinus tachycardia. Heart rates ranged from 58 to 184 bpm, averaging 80 bpm.  ECHO (06/02/2020): Normal cardiac segmental anatomy, chamber sizes and biventricular systolic function. The atrial and ventricular septae are intact. There is no significant valvular dysfunction. Normal aortic dimensions with no evidence of AV valve prolapse.  HOLTER (3/11 - 06/04/20): 1d19h of data available for review. The observed rhythms are sinus bradycardia to sinus tachycardia. The Maximum Heart Rate recorded was 147 bpm, Day 2  / 06:05:07 am, the Minimum Heart Rate recorded was 51 bpm, Day 2 / 09:12:26 pm and the Average Heart Rate was 69 bpm. There were 6 PVCs with a burden of < 0.01 %. There were 7 PSVCs with a burden of < 0.01 %. There were 0 Patient triggered events. Adequate beta blockade. No significant tachycardia burden.  EKG Interpretation Date/Time:  Tuesday September 16 2023 08:40:23 EDT Ventricular Rate:  76 PR Interval:  184 QRS Duration:  96 QT Interval:  386 QTC Calculation: 434 R Axis:   107  Text Interpretation: Normal sinus rhythm Rightward axis Incomplete right bundle branch block When compared with ECG of 07-Dec-2016 14:43, No significant change was found Confirmed by [place], Huntley Knoop [phone]) on 09/16/2023 8:50:44 AM    Recent Labs: 05/27/2023: BUN 11; Creatinine, Ser 0.49; Hemoglobin 14.7; Magnesium 1.9; Platelets 351; Potassium 3.9; Sodium 135  Recent Lipid Panel No results found for: CHOL, TRIG, HDL, CHOLHDL, VLDL, LDLCALC, LDLDIRECT   Risk Assessment/Calculations:                Physical Exam:    VS:  BP 110/78   Pulse 76   Ht 5' 6 (1.676 m)   Wt 181 lb (82.1 kg)   SpO2 97%   BMI 29.21 kg/m     Wt Readings from Last 3 Encounters:  09/16/23 181 lb (82.1 kg)  02/03/23 165 lb 6.4 oz (75 kg)  10/28/22 159 lb 12.8 oz (72.5 kg) (87%, Z= 1.13)*   * Growth percentiles are based on CDC (Girls, 2-20 Years) data.     GEN:  Well nourished, well developed in no acute distress HEENT: Normal NECK: No JVD; No carotid bruits LYMPHATICS: No lymphadenopathy CARDIAC: RRR, no murmurs, rubs, gallops. + pectus excavatum deformity RESPIRATORY:  Clear to auscultation without rales, wheezing or rhonchi  ABDOMEN: Soft, non-tender, non-distended MUSCULOSKELETAL:  No edema; No deformity  SKIN: Warm and dry NEUROLOGIC:  Alert and oriented x 3 PSYCHIATRIC:  Normal affect   ASSESSMENT:    1. POTS (postural orthostatic tachycardia syndrome)   2. Ectopic atrial rhythm    PLAN:    In  order of problems listed above:  Dysautonomia syndrome. Extensive evaluation in the past. Symptoms currently doing pretty well on combination of Florinef , midodrine  and metoprolol .  No change today. Discussed importance of hydration. She states compression hose are too uncomfortable to wear. Recommend Spanx. Discussed supine exercise with recumbent bike and/or water exercise. Will follow up in 6 months Atrial tachycardia controlled on metoprolol            Medication Adjustments/Labs and Tests Ordered: Current medicines are reviewed at length with the patient today.  Concerns regarding medicines are outlined above.  Orders Placed This Encounter  Procedures   EKG 12-Lead   No orders of the defined types were placed in this encounter.   Patient Instructions  Medication Instructions:  Continue same medications *If you need a refill on your cardiac medications before your next appointment, please call your pharmacy*  Lab Work: None ordered  Testing/Procedures: None ordered  Follow-Up: At [facility], you and your health needs are our priority.  As part of our continuing mission to provide you with exceptional heart care, our providers are all part of one team.  This team includes your primary Cardiologist (physician) and Advanced Practice Providers or APPs (Physician Assistants and Nurse Practitioners) who all work together to provide you with the care you need, when you need it.  Your next appointment:  6 months     Call in August to schedule Dec appointment     Provider:  Dr.Staley Budzinski   We recommend signing up for the patient portal called MyChart.  Sign up information is provided on this After Visit Summary.  MyChart is used to connect with patients for Virtual Visits (Telemedicine).  Patients are able to view lab/test results, encounter notes, upcoming appointments, etc.  Non-urgent messages can be sent to your provider as well.   To learn more about what you can do  with MyChart, go to [email or web address].           Signed, Lanell Carpenter [place], MD  09/16/2023 9:43 AM    [facility]

## 2023-09-15 ENCOUNTER — Encounter: Payer: Self-pay | Admitting: Adult Health

## 2023-09-15 ENCOUNTER — Other Ambulatory Visit (HOSPITAL_COMMUNITY): Payer: Self-pay

## 2023-09-15 ENCOUNTER — Other Ambulatory Visit: Payer: Self-pay

## 2023-09-15 ENCOUNTER — Telehealth (INDEPENDENT_AMBULATORY_CARE_PROVIDER_SITE_OTHER): Admitting: Adult Health

## 2023-09-15 DIAGNOSIS — F341 Dysthymic disorder: Secondary | ICD-10-CM | POA: Diagnosis not present

## 2023-09-15 DIAGNOSIS — F5082 Avoidant/restrictive food intake disorder: Secondary | ICD-10-CM

## 2023-09-15 NOTE — Progress Notes (Signed)
 April Williams [id number] [birth year] 21 y.o.  Virtual Visit via Video Note  I connected with pt @ on 09/15/23 at  1:30 PM EDT by a video enabled telemedicine application and verified that I am speaking with the correct person using two identifiers.   I discussed the limitations of evaluation and management by telemedicine and the availability of in person appointments. The patient expressed understanding and agreed to proceed.  I discussed the assessment and treatment plan with the patient. The patient was provided an opportunity to ask questions and all were answered. The patient agreed with the plan and demonstrated an understanding of the instructions.   The patient was advised to call back or seek an in-person evaluation if the symptoms worsen or if the condition fails to improve as anticipated.  I provided 15 minutes of non-face-to-face time during this encounter.  The patient was located at home.  The provider was located at [facility].   Angeline LOISE Sayers, NP   Subjective:   Patient ID:  April Williams is a 23 y.o. (DOB 29-Mar-2000) female.  Chief Complaint: No chief complaint on file.   HPI April Williams presents for follow-up of persistent depressive disorder with melancholic features, currently moderate, mixed obsessional thoughts and acts, and avoidant-restrictive food intake disorder (ARFID).  Describes mood today as ok. Pleasant. Reports tearfulness at times - hormonal. Mood symptoms - denies depression, anxiety and irritability. Reports varying interest and motivation - up and down. Denies panic attacks. Denies worry and rumination. Reports over thinking. Denies intrusive thoughts. Reports mood is more stable. Stating I think I'm doing better, more stable with emotions. Feels like current medication regimen is helpful. Taking medications as prescribed.  Energy levels stable - it's been good. Active, does not have a regular exercise routine. Enjoys  some usual interests and activities. Single. Lives with mother. Sister lives 1.5 hours away - father and brother lives in [place] . Spending time with family. Appetite adequate. Weight stable. Reports sleeping well most nights. Averages 6 hours.  Focus and concentration stable. Completing some household tasks. Managing aspects of household. Taking 2 online classes at a community college - [employer].  Denies SI or HI.  Denies AH or VH. Denies self harm. Denies substance use.  Previous medication trials: Pristiq    Review of Systems:  Review of Systems  Musculoskeletal:  Negative for gait problem.  Neurological:  Negative for tremors.  Psychiatric/Behavioral:         Please refer to HPI    Medications: I have reviewed the patient's current medications.  Current Outpatient Medications  Medication Sig Dispense Refill   ARIPiprazole  (ABILIFY ) 2 MG tablet Take 1 tablet (2 mg total) by mouth daily. 90 tablet 3   cholecalciferol (VITAMIN D ) 1000 units tablet Take 4,000 Units by mouth daily.     ferrous sulfate  324 (65 Fe) MG TBEC Take 1 tablet by mouth in the morning and 1 tablet in the evening with meals. If causes constipation, begin miralax daily as needed. Check Hgb/ferritin in 3 months.. 90 tablet 3   fludrocortisone  (FLORINEF ) 0.1 MG tablet Take 2 tablets (0.2 mg total) by mouth daily. 180 tablet 0   LO LOESTRIN FE  1 MG-10 MCG / 10 MCG tablet Take 1 tablet by mouth daily. 84 tablet 4   Magnesium Hydroxide 1200 MG CHEW Chew 1,200 mg by mouth daily.     Melatonin 10 MG TABS Take by mouth.     metoprolol  tartrate (LOPRESSOR ) 25 MG tablet Take 1 tablet (  25 mg total) by mouth daily. 90 tablet 2   midodrine  (PROAMATINE ) 5 MG tablet Take 2 tablets (10 mg total) by mouth 3 (three) times daily. 540 tablet 3   senna (SENOKOT) 8.6 MG tablet Take 2 tablets by mouth daily.     sertraline  (ZOLOFT ) 100 MG tablet Take 2 tablets (200 mg total) by mouth daily. 180 tablet 3   traZODone  (DESYREL ) 50 MG tablet  Take 1 tablet (50 mg total) by mouth at bedtime. 90 tablet 3   No current facility-administered medications for this visit.    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    Irritable bowel syndrome    Lump of right breast 06/30/2018   Nasal polyps    Obsessive-compulsive disorder    Physical deconditioning 04/21/2018   Plantar wart    POTS (postural orthostatic tachycardia syndrome)    Scoliosis of thoracolumbar spine    Vitamin D  deficiency     Family History  Problem Relation Age of Onset   Depression Mother    OCD Mother    Obesity Mother    Asthma Brother    Stroke Maternal Uncle    Depression Maternal Grandmother    Diabetes Paternal Grandmother    Seizures Other    Migraines Neg Hx    Anxiety disorder Neg Hx    Bipolar disorder Neg Hx    Schizophrenia Neg Hx    ADD / ADHD Neg Hx    Autism Neg Hx     Social History   Socioeconomic History   Marital status: Single    Spouse name: Not on file   Number of children: Not on file   Years of education: Not on file   Highest education level: 9th grade  Occupational History   Occupation: student  Tobacco Use   Smoking status: Never    Passive exposure: Never   Smokeless tobacco: Never  Vaping Use   Vaping status: Never Used  Substance and Sexual Activity   Alcohol use: Never   Drug use: Never   Sexual activity: Never  Other Topics Concern   Not on file  Social History Narrative   Jerie is at [employer] for dual enrollment with an associates in Retail buyer. She  would like to be an Transport planner.       She lives with mother and goes to her father's house every other weekend. She has a Cat and a Hedgehog   Has been primarily with mother since Covid 19 restrictions.    She enjoys playing video games, sing, and sew.          06/15/2019: Self-directed in schoolwork generally A and B grades currently taking AP psychology planning considering herself undeserving for low grade or delayed assignment completion which is always B or better than average.  Patient wishes to have dual educational program next year in 11th grade with classes also at [employer]. She has been inflexible and constricted in repertoire of nutrition and activity, though she can spontaneously enjoy social activity with friends  as recent as a year ago as well as volunteering for special needs children to have Friday night activities before COVID.  Otherwise she has maintained only 1 friend though she enjoys the cat at home though having to clean after it.  Patient feels tormented by older sister who has a 13-month-old baby patient loves as with father or psychologist wanting her to gain weight to become healthy.   Social Drivers of Corporate investment banker Strain: Low Risk  (02/03/2023)   Overall Financial Resource Strain (CARDIA)    Difficulty of Paying Living Expenses: Not very hard  Food Insecurity: No Food Insecurity (02/03/2023)   Hunger Vital Sign    Worried About Running Out of Food in the Last Year: Never true    Ran Out of Food in the Last Year: Never true  Transportation Needs: No Transportation Needs (02/03/2023)   PRAPARE - Administrator, Civil Service (Medical): No    Lack of Transportation (Non-Medical): No  Physical Activity: Insufficiently Active (02/03/2023)   Exercise Vital Sign    Days of Exercise per Week: 1 day    Minutes of Exercise per Session: 20 min  Stress: Stress Concern Present (02/03/2023)   [employer] of Occupational Health - Occupational  Stress Questionnaire    Feeling of Stress : Rather much  Social Connections: Socially Isolated (02/03/2023)   Social Connection and Isolation Panel    Frequency of Communication with Friends and Family: More than three times a week    Frequency of Social Gatherings with Friends and Family: Never    Attends Religious Services: Never    Database administrator or Organizations: No    Attends Banker Meetings: Never    Marital Status: Never married  Intimate Partner Violence: Not At Risk (02/03/2023)   Humiliation, Afraid, Rape, and Kick questionnaire    Fear of Current or Ex-Partner: No    Emotionally Abused: No    Physically Abused: No    Sexually Abused: No    Past Medical History, Surgical history, Social history, and Family history were reviewed and updated as appropriate.   Please see review of systems for further details on the patient's review from today.   Objective:   Physical Exam:  There were no vitals taken for this visit.  Physical Exam Constitutional:      General: She is not in acute distress.  Musculoskeletal:        General: No deformity.   Neurological:     Mental Status: She is alert and oriented to person, place, and time.     Coordination: Coordination normal.   Psychiatric:        Attention and Perception: Attention and perception normal. She does not perceive auditory or visual hallucinations.        Mood and Affect: Mood normal. Mood is not anxious or depressed. Affect is not labile, blunt, angry or inappropriate.        Speech: Speech normal.        Behavior: Behavior normal.        Thought Content: Thought content normal. Thought content is not paranoid or delusional. Thought content does not include homicidal or suicidal ideation. Thought content does not include homicidal or suicidal plan.        Cognition and Memory: Cognition and memory normal.        Judgment: Judgment normal.     Comments: Insight intact     Lab Review:  Component Value Date/Time   NA 135 05/27/2023 1630   K 3.9 05/27/2023 1630   CL 100 05/27/2023 1630   CO2 24 05/27/2023 1630   GLUCOSE 86 05/27/2023 1630   BUN 11 05/27/2023 1630   CREATININE 0.49 05/27/2023 1630   CREATININE 0.70 01/27/2019 1015   CALCIUM 9.3 05/27/2023 1630   PROT 7.2 01/27/2019 1015   ALBUMIN 4.1 12/07/2016 1402   AST 13 01/27/2019 1015   ALT 10 01/27/2019 1015   ALKPHOS 117 12/07/2016 1402   BILITOT 0.7 01/27/2019 1015   GFRNONAA >60 05/27/2023 1630   GFRAA NOT CALCULATED 12/07/2016 1402       Component Value Date/Time   WBC 10.7 (H) 05/27/2023 1630   RBC 5.36 (H) 05/27/2023 1630   HGB 14.7 05/27/2023 1630   HGB 15.6 (H) 10/28/2022 1236   HCT 43.3 05/27/2023 1630   PLT 351 05/27/2023 1630   PLT 456 (H) 10/28/2022 1236   MCV 80.8 05/27/2023 1630   MCH 27.4 05/27/2023 1630   MCHC 33.9 05/27/2023 1630   RDW 12.3 05/27/2023 1630   LYMPHSABS 1.9 10/28/2022 1236   MONOABS 0.7 10/28/2022 1236   EOSABS 0.1 10/28/2022 1236   BASOSABS 0.0 10/28/2022 1236    No results found for: POCLITH, LITHIUM   No results found for: PHENYTOIN, PHENOBARB, VALPROATE, CBMZ   .res Assessment: Plan:    Plan:  PDMP reviewed  1. Zoloft  200mg  daily 2. Abilify  2mg  daily  3. Trazadone 25mg  - 2 at hs   Consider Vraylar   RTC 3 months  15 minutes spent dedicated to the care of this patient on the date of this encounter to include pre-visit review of records, ordering of medication, post visit documentation, and face-to-face time with the patient discussing depression, anxiety and obsessional thoughts. Discussed continuing current medication regimen.  Discussed potential metabolic side effects associated with atypical antipsychotics, as well as potential risk for movement side effects. Advised pt to contact office if movement side effects occur.    There are no diagnoses linked to this encounter.   Please see After Visit Summary for patient specific  instructions.  Future Appointments  Date Time Provider Department Center  09/15/2023  1:30 PM Dejai Schubach, Angeline Mattocks, NP CP-CP None  09/16/2023  8:40 AM [place], Peter M, MD [facility] H&V  09/16/2023 11:10 AM Skeet Juliene SAUNDERS, DO [facility] None  02/10/2024  9:15 AM Early, Camie BRAVO, NP P[facility]    No orders of the defined types were placed in this encounter.     -------------------------------

## 2023-09-15 NOTE — Progress Notes (Unsigned)
 NEUROLOGY CONSULTATION NOTE  April Williams MRN: [id number] DOB: [birth year]  Referring provider: Sid Birmingham, PA-C (ED referral) Primary care provider: Camie Doing, NP  Reason for consult:  migraines, numbness  Assessment/Plan:   Chronic daily headaches/new daily persistent headache, unclear if post-viral. Episode of left sided numbness with headache may have been Migraine with aura Dysautonomia - POTS Dysesthesias/paresthesias - consider underlying small fiber neuropathy   For treatment of headache, will initiate zonisamide 25mg  daily titrating to 100mg  daily over 4 weeks.   Workup: MRI of brain with and without contrast NCV-EMG  Check labs:  ANA, ENA panel, RF, sed rate, CRP, B12, TSH Follow up in 8 months.    Total time spent in chart and face to face with patient:  67 minutes.   Subjective:  April Williams is a 23 year old right-handed female with dysautonomia, IBS, anxiety, depression, OCD, iron-deficiency anemia and eating disorder who presents for migraines and numbness.  History supplemented by her accompanying her mother and notes from the ED.  CT head personally reviewed.  She began experiencing various symptoms after having the flu and Strept around 2018 when she was 23 years old.  She started experiencing orthostatic hypotension and tachycardia.  She was subsequently diagnosed with POTS in February 2019.  Since then, she has been experiencing numbness and tingling that comes and goes, often with exertion.  Fingertips feel numb and hot.  She cannot feel cold water on her fingertips.  She has burning sensation in the legs and feet feels numb.  Fingertips and toes become red.  She also has hyperesthesia.  Slight touch causes full body shivers.  Since early 2025, she has experienced a jolting pain in her left ring finger lasting a few minutes.  Often occurs when she is sitting with her elbow resting.  She reports weak joints and notes electric pain in the knees and  elbows when in use or in the jaw when talking or eating.  She has been evaluated by rheumatology in the past for connective tissue disease but workup was unremarkable.  She also  has had gastroenterologic symptoms such as both constipation and diarrhea.  Has been diagnosed by GI with IBS.    Also she has had a persistent daily headache as well.  It is described as a diffuse 3/10 aching pain without associated nausea, vomiting, photophobia, phonophobia, visual disturbance.    On 05/27/2023, she woke up with numbness, tingling and pain in her left arm and left calf.  Her left arm was flopping and she couldn't move it.  She did endorse left sided neck pain.  She also had an exacerbation of her headache, a 4-5/10 severity.  She had recently started on oral birth control and was concerned that she may have developed a blood clot.  No shortness or breath or chest pain.  She went to the ED for further evaluation.  CT head was normal.  Venous doppler of left upper and lower extremities were negative for DVT.  She was treated with Tylenol .     Past NSAIDS/analgesics:  ibuprofen , Tylenol  Past abortive triptans:  none Past abortive ergotamine:  none Past muscle relaxants:  none Past anti-emetic:  promethazine  Past antihypertensive medications:  none Past antidepressant medications:  amitriptyline , desvenlafaxine  Past anticonvulsant medications:  topiramate , gabapentin  (headache, insomnia) Past anti-CGRP:  none Past vitamins/Herbal/Supplements:  none Past antihistamines/decongestants:  Xyzal Other past therapies:  none  Current NSAIDS/analgesics:  Aleve Current triptans:  none Current ergotamine:  none Current anti-emetic:  none Current muscle relaxants:  none Current Antihypertensive medications:  metoprolol  tartrate Current Antidepressant medications:  sertraline  200mg  daily Current Anticonvulsant medications:  none Current anti-CGRP:  none Current Vitamins/Herbal/Supplements:  ferrous sulfate ,  melatonin, magnesium hydroxide Current Antihistamines/Decongestants:  none Other therapy:  none Birth control:  Lo Loestrin Fe  Other medications:  Abilify , trazodone , Florinef , midodrine    Caffeine:  32 oz Dr. Nunzio daily.  No coffee. Sleep hygiene:  poor.  Needs to treat with medication. 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 90 tablet 3   cholecalciferol (VITAMIN D ) 1000 units tablet Take 4,000 Units by mouth daily.     ferrous sulfate  324 (65 Fe) MG TBEC Take 1 tablet by mouth in the morning and 1 tablet in the evening with meals. If causes constipation, begin miralax daily as needed. Check Hgb/ferritin in 3 months.. 90 tablet 3   fludrocortisone  (FLORINEF ) 0.1 MG tablet Take 2 tablets (0.2 mg total) by  mouth daily. 180 tablet 0   LO LOESTRIN FE  1 MG-10 MCG / 10 MCG tablet Take 1 tablet by mouth daily. 84 tablet 4   Magnesium Hydroxide 1200 MG CHEW Chew 1,200 mg by mouth daily.     Melatonin 10 MG TABS Take by mouth.     metoprolol  tartrate (LOPRESSOR ) 25 MG tablet Take 1 tablet (25 mg total) by mouth daily. 90 tablet 2   midodrine  (PROAMATINE ) 5 MG tablet Take 2 tablets (10 mg total) by mouth 3 (three) times daily. 540 tablet 3   senna (SENOKOT) 8.6 MG tablet Take 2 tablets by mouth daily.     sertraline  (ZOLOFT ) 100 MG tablet Take 2 tablets (200 mg total) by mouth daily. 180 tablet 3   traZODone  (DESYREL ) 50 MG tablet Take 1 tablet (50 mg total) by mouth at bedtime. 90 tablet 3   No current facility-administered medications on file prior to visit.    ALLERGIES: No Known Allergies  FAMILY HISTORY: Family History  Problem Relation Age of Onset   Depression Mother    OCD Mother    Obesity Mother    Asthma Brother    Stroke Maternal Uncle    Depression Maternal Grandmother    Diabetes Paternal Grandmother    Seizures Other  Migraines Neg Hx    Anxiety disorder Neg Hx    Bipolar disorder Neg Hx    Schizophrenia Neg Hx    ADD / ADHD Neg Hx    Autism Neg Hx     Objective:  Blood pressure 135/79, pulse 93, height 5' 6 (1.676 m), weight 181 lb (82.1 kg), last menstrual period 09/14/2023, SpO2 96%. General: No acute distress.  Patient appears well-groomed.   Head:  Normocephalic/atraumatic Eyes:  fundi examined but not visualized Neck: supple, no paraspinal tenderness, full range of motion Heart: regular rate and rhythm Neurological Exam: Mental status: alert and oriented to person, place, and time, speech fluent and not dysarthric, language intact. Cranial nerves: CN I: not tested CN II: pupils equal, round and reactive to light, visual fields intact CN III, IV, VI:  full range of motion, no nystagmus, no ptosis CN V: facial sensation intact. CN VII: upper and lower face  symmetric CN VIII: hearing intact CN IX, X: gag intact, uvula midline CN XI: sternocleidomastoid and trapezius muscles intact CN XII: tongue midline Bulk & Tone: normal, no fasciculations. Motor:  muscle strength 5/5 throughout Sensation: Pinprick and vibratory sensation intact. Deep Tendon Reflexes:  2+ throughout,  toes downgoing.   Finger to nose testing:  Without dysmetria.    Gait:  Normal station and stride.  able to tandem walk.  Romberg negative.    Thank you for allowing me to take part in the care of this patient.  Juliene Dunnings, DO  CC: Camie Doing, NP

## 2023-09-16 ENCOUNTER — Other Ambulatory Visit

## 2023-09-16 ENCOUNTER — Other Ambulatory Visit: Payer: Self-pay

## 2023-09-16 ENCOUNTER — Encounter: Payer: Self-pay | Admitting: Neurology

## 2023-09-16 ENCOUNTER — Other Ambulatory Visit (HOSPITAL_COMMUNITY): Payer: Self-pay

## 2023-09-16 ENCOUNTER — Encounter: Payer: Self-pay | Admitting: Cardiology

## 2023-09-16 ENCOUNTER — Ambulatory Visit: Attending: Cardiology | Admitting: Cardiology

## 2023-09-16 ENCOUNTER — Ambulatory Visit: Admitting: Neurology

## 2023-09-16 VITALS — BP 110/78 | HR 76 | Ht 66.0 in | Wt 181.0 lb

## 2023-09-16 VITALS — BP 135/79 | HR 93 | Ht 66.0 in | Wt 181.0 lb

## 2023-09-16 DIAGNOSIS — R208 Other disturbances of skin sensation: Secondary | ICD-10-CM

## 2023-09-16 DIAGNOSIS — G43109 Migraine with aura, not intractable, without status migrainosus: Secondary | ICD-10-CM | POA: Diagnosis not present

## 2023-09-16 DIAGNOSIS — I491 Atrial premature depolarization: Secondary | ICD-10-CM | POA: Diagnosis not present

## 2023-09-16 DIAGNOSIS — G90A Postural orthostatic tachycardia syndrome (POTS): Secondary | ICD-10-CM | POA: Diagnosis not present

## 2023-09-16 DIAGNOSIS — G901 Familial dysautonomia [Riley-Day]: Secondary | ICD-10-CM | POA: Diagnosis not present

## 2023-09-16 DIAGNOSIS — R519 Headache, unspecified: Secondary | ICD-10-CM

## 2023-09-16 MED ORDER — NORTRIPTYLINE HCL 10 MG PO CAPS
10.0000 mg | ORAL_CAPSULE | Freq: Every day | ORAL | 5 refills | Status: DC
  Filled 2023-09-16: qty 30, 30d supply, fill #0

## 2023-09-16 MED ORDER — ZONISAMIDE 25 MG PO CAPS
ORAL_CAPSULE | ORAL | 0 refills | Status: DC
  Filled 2023-09-16: qty 120, 30d supply, fill #0

## 2023-09-16 NOTE — Patient Instructions (Signed)
 Medication Instructions:  Continue same medications *If you need a refill on your cardiac medications before your next appointment, please call your pharmacy*  Lab Work:  None ordered  Testing/Procedures: None ordered  Follow-Up: At [facility], you and your health needs are our priority.  As part of our continuing mission to provide you with exceptional heart care, our providers are all part of one team.  This team includes your primary Cardiologist (physician) and Advanced Practice Providers or APPs (Physician Assistants and Nurse Practitioners) who all work together to provide you with the care you need, when you need it.  Your next appointment:  6 months   Call in August to schedule Dec appointment     Provider:  Dr.Jordan   We recommend signing up for the patient portal called MyChart.  Sign up information is provided on this After Visit Summary.  MyChart is used to connect with patients for Virtual Visits (Telemedicine).  Patients are able to view lab/test results, encounter notes, upcoming appointments, etc.  Non-urgent messages can be sent to your provider as well.   To learn more about what you can do with MyChart, go to [email or web address].

## 2023-09-16 NOTE — Patient Instructions (Addendum)
 Start zonisamide 25mg  capsule titrating to 4 pills daily.  Contact me for refill and I will send in a 100mg  pill daily Check MRI of brain with and without contrast.We have sent a referral to [facility] for your MRI and they will call you directly to schedule your appointment. They are located at [address] [facility]. If you need to contact them directly please call [phone].  Check nerve conduction study left arm and leg Check labs:  ANA with ENA, RF, sed rate, CRP, B12, TSH Follow up 8 months.

## 2023-09-18 LAB — ANA, IFA COMPREHENSIVE PANEL
Anti Nuclear Antibody (ANA): NEGATIVE
ENA SM Ab Ser-aCnc: <1 AI
SM/RNP: <1 AI
SSA (Ro) (ENA) Antibody, IgG: <1 AI
SSB (La) (ENA) Antibody, IgG: <1 AI
Scleroderma (Scl-70) (ENA) Antibody, IgG: <1 AI
ds DNA Ab: 1 [IU]/mL

## 2023-09-18 LAB — C-REACTIVE PROTEIN: CRP: <3 mg/L (ref <8.0)

## 2023-09-18 LAB — SEDIMENTATION RATE: Sed Rate: 9 mm/h (ref 0–20)

## 2023-09-18 LAB — TSH: TSH: 2.04 m[IU]/L

## 2023-09-18 LAB — VITAMIN B12: Vitamin B-12: 291 pg/mL (ref 200–1100)

## 2023-09-19 ENCOUNTER — Ambulatory Visit: Payer: Self-pay | Admitting: Neurology

## 2023-09-23 ENCOUNTER — Encounter: Payer: Self-pay | Admitting: Neurology

## 2023-09-27 ENCOUNTER — Other Ambulatory Visit (HOSPITAL_COMMUNITY): Payer: Self-pay

## 2023-09-29 ENCOUNTER — Encounter (HOSPITAL_COMMUNITY): Payer: Self-pay

## 2023-09-30 ENCOUNTER — Other Ambulatory Visit: Payer: Self-pay

## 2023-09-30 ENCOUNTER — Other Ambulatory Visit (HOSPITAL_COMMUNITY): Payer: Self-pay

## 2023-09-30 ENCOUNTER — Other Ambulatory Visit: Payer: Self-pay | Admitting: Adult Health

## 2023-09-30 DIAGNOSIS — F422 Mixed obsessional thoughts and acts: Secondary | ICD-10-CM

## 2023-09-30 MED ORDER — TRAZODONE HCL 100 MG PO TABS
100.0000 mg | ORAL_TABLET | Freq: Every day | ORAL | 0 refills | Status: DC
  Filled 2023-09-30: qty 30, 30d supply, fill #0

## 2023-09-30 NOTE — Telephone Encounter (Signed)
 Pt was seen in June: Trazadone 25mg  - 2 at hs   She said you told her she could increase to 100 mg. She has been taking this and needs a RF. Is it ok to send in Rx for 100 mg?

## 2023-10-01 ENCOUNTER — Other Ambulatory Visit (HOSPITAL_COMMUNITY): Payer: Self-pay

## 2023-10-10 ENCOUNTER — Other Ambulatory Visit (HOSPITAL_COMMUNITY): Payer: Self-pay

## 2023-10-16 ENCOUNTER — Ambulatory Visit: Admitting: Neurology

## 2023-10-17 DIAGNOSIS — S61452A Open bite of left hand, initial encounter: Secondary | ICD-10-CM | POA: Diagnosis not present

## 2023-10-20 ENCOUNTER — Ambulatory Visit
Admission: RE | Admit: 2023-10-20 | Discharge: 2023-10-20 | Disposition: A | Discharge status: Home / Self Care | Source: Ambulatory Visit | Attending: Neurology

## 2023-10-20 DIAGNOSIS — G901 Familial dysautonomia [Riley-Day]: Secondary | ICD-10-CM

## 2023-10-20 DIAGNOSIS — G43909 Migraine, unspecified, not intractable, without status migrainosus: Secondary | ICD-10-CM | POA: Diagnosis not present

## 2023-10-20 DIAGNOSIS — G43109 Migraine with aura, not intractable, without status migrainosus: Secondary | ICD-10-CM

## 2023-10-20 DIAGNOSIS — R208 Other disturbances of skin sensation: Secondary | ICD-10-CM

## 2023-10-20 MED ORDER — GADOPICLENOL 0.5 MMOL/ML IV SOLN
9.0000 mL | Freq: Once | INTRAVENOUS | Status: AC | PRN
  Administered 2023-10-20: 9 mL via INTRAVENOUS

## 2023-10-21 ENCOUNTER — Telehealth: Payer: Self-pay

## 2023-10-21 NOTE — Telephone Encounter (Signed)
 Called Pt and informed her of results of MRI per Dr. Skeet. She understood.

## 2023-10-22 ENCOUNTER — Other Ambulatory Visit: Payer: Self-pay

## 2023-10-27 DIAGNOSIS — S83421A Sprain of lateral collateral ligament of right knee, initial encounter: Secondary | ICD-10-CM | POA: Diagnosis not present

## 2023-10-28 ENCOUNTER — Other Ambulatory Visit: Payer: Self-pay

## 2023-10-28 ENCOUNTER — Ambulatory Visit (INDEPENDENT_AMBULATORY_CARE_PROVIDER_SITE_OTHER): Admitting: Neurology

## 2023-10-28 ENCOUNTER — Other Ambulatory Visit (HOSPITAL_COMMUNITY): Payer: Self-pay

## 2023-10-28 DIAGNOSIS — R208 Other disturbances of skin sensation: Secondary | ICD-10-CM

## 2023-10-28 MED ORDER — MELOXICAM 15 MG PO TABS
15.0000 mg | ORAL_TABLET | Freq: Every day | ORAL | 2 refills | Status: DC
  Filled 2023-10-28 (×2): qty 60, 60d supply, fill #0

## 2023-10-28 NOTE — Procedures (Signed)
 [facility]  [address], [address]  [place], [place] [address] Tel: [phone] Fax: [phone] Test Date:  10/28/2023  Patient: April Williams DOB: 03-05-2001 Physician: Venetia Potters, MD  Sex: Female Height: 5' 6 Ref Phys: Juliene Dunnings, DO  ID#: [id number]   Technician:    History: This is a 23 year old female with numbness and tingling in her left upper and lower limbs.  NCV & EMG Findings: Extensive electrodiagnostic evaluation of the left upper and lower limbs shows: Left sural, superficial peroneal/fibular, median, ulnar, and radial sensory responses are within normal limits. Left peroneal/fibular (EDB), tibial (AH), median (APB), and ulnar (ADM) motor responses are within normal limits. Left H reflex latency is within normal limits. There is no evidence of active or chronic motor axon loss changes affecting any of the tested muscles on needle examination. Motor unit configuration and recruitment pattern is within normal limits.  Impression: This is a normal study. Specifically: No electrodiagnostic evidence of a large fiber sensorimotor neuropathy or myopathy. No electrodiagnostic evidence of a left cervical (C5-C8) or lumbosacral (L3-S1) motor radiculopathy. No electrodiagnostic evidence of a left median mononeuropathy at or distal to the wrist (ie: carpal tunnel syndrome).    ___________________________ Venetia Potters, MD    Nerve Conduction Studies Motor Nerve Results    Latency Amplitude F-Lat Segment Distance CV Comment  Site (ms) Norm (mV) Norm (ms)  (cm) (m/s) Norm   Left Fibular (EDB) Motor  Ankle 3.2  < 5.5 4.8  > 3.0        Bel fib head 10.3 - 3.8 -  Bel fib head-Ankle 30.5 43  > 41   Pop fossa 12.4 - 3.7 -  Pop fossa-Bel fib head 9 43 -   Left Median (APB) Motor  Wrist 2.2  < 3.9 9.2  > 6.0        Elbow 6.7 - 9.2 -  Elbow-Wrist 27 60  > 51   Left Tibial (AH) Motor  Ankle 3.5  < 5.8 13.8  > 8.0        Knee 12.5 - 8.9 -  Knee-Ankle 38 42  >  41   Left Ulnar (ADM) Motor  Wrist 1.75  < 3.0 10.5  > 8.0        Bel elbow 5.1 - 9.6 -  Bel elbow-Wrist 19.5 57  > 51   Ab elbow 7.0 - 9.3 -  Ab elbow-Bel elbow 10 53 -    Sensory Sites    Neg Peak Lat Amplitude (O-P) Segment Distance Velocity Comment  Site (ms) Norm (V) Norm  (cm) (ms)   Left Median Sensory  Wrist-Dig II 2.8  < 3.3 64  > 20 Wrist-Dig II 13    Left Radial Sensory  Forearm-Wrist 2.3  < 2.7 20  > 18 Forearm-Wrist 10    Left Superficial Fibular Sensory  14 cm-Ankle 2.7  < 4.4 8  > 6 14 cm-Ankle 10    Left Sural Sensory  Calf-Lat mall 3.9  < 4.4 7  > 6 Calf-Lat mall 14    Left Ulnar Sensory  Wrist-Dig V 2.5  < 3.3 33  > 18 Wrist-Dig V 11     H-Reflex Results    M-Lat H Lat H Neg Amp H-M Lat  Site (ms) (ms) Norm (mV) (ms)  Left Tibial H-Reflex  Pop fossa 5.7 31.3  < 35.0 2.9 25.6   Electromyography   Side Muscle Ins.Act Fibs Fasc Recrt Amp Dur Poly Activation Comment  Left Tib ant Nml Nml Nml Nml Nml Nml Nml Nml N/A  Left Gastroc MH Nml Nml Nml Nml Nml Nml Nml Nml N/A  Left FDL Nml Nml Nml Nml Nml Nml Nml Nml N/A  Left Rectus fem Nml Nml Nml Nml Nml Nml Nml Nml N/A  Left Gluteus med Nml Nml Nml Nml Nml Nml Nml Nml N/A  Left FDI Nml Nml Nml Nml Nml Nml Nml Nml N/A  Left EIP Nml Nml Nml Nml Nml Nml Nml Nml N/A  Left Pronator teres Nml Nml Nml Nml Nml Nml Nml Nml N/A  Left Biceps Nml Nml Nml Nml Nml Nml Nml Nml N/A  Left Triceps lat hd Nml Nml Nml Nml Nml Nml Nml Nml N/A  Left Deltoid Nml Nml Nml Nml Nml Nml Nml Nml N/A      Waveforms:  Motor           Sensory             H-Reflex

## 2023-10-29 NOTE — Telephone Encounter (Signed)
 Pt LM with AN. She missed a call regarding results

## 2023-10-30 ENCOUNTER — Other Ambulatory Visit: Payer: Self-pay | Admitting: Adult Health

## 2023-10-31 ENCOUNTER — Other Ambulatory Visit: Payer: Self-pay

## 2023-10-31 ENCOUNTER — Other Ambulatory Visit (HOSPITAL_COMMUNITY): Payer: Self-pay

## 2023-10-31 MED ORDER — TRAZODONE HCL 100 MG PO TABS
100.0000 mg | ORAL_TABLET | Freq: Every day | ORAL | 1 refills | Status: DC
Start: 2023-10-31 — End: 2023-12-16
  Filled 2023-10-31: qty 30, 30d supply, fill #0
  Filled 2023-12-03: qty 30, 30d supply, fill #1

## 2023-11-27 DIAGNOSIS — Q676 Pectus excavatum: Secondary | ICD-10-CM | POA: Diagnosis not present

## 2023-11-27 DIAGNOSIS — R0789 Other chest pain: Secondary | ICD-10-CM | POA: Diagnosis not present

## 2023-11-27 DIAGNOSIS — I4589 Other specified conduction disorders: Secondary | ICD-10-CM | POA: Diagnosis not present

## 2023-11-27 DIAGNOSIS — I491 Atrial premature depolarization: Secondary | ICD-10-CM | POA: Diagnosis not present

## 2023-11-27 DIAGNOSIS — G90A Postural orthostatic tachycardia syndrome (POTS): Secondary | ICD-10-CM | POA: Diagnosis not present

## 2023-11-27 DIAGNOSIS — G901 Familial dysautonomia [Riley-Day]: Secondary | ICD-10-CM | POA: Diagnosis not present

## 2023-12-01 ENCOUNTER — Other Ambulatory Visit: Payer: Self-pay

## 2023-12-01 ENCOUNTER — Other Ambulatory Visit (HOSPITAL_COMMUNITY)
Admission: RE | Admit: 2023-12-01 | Discharge: 2023-12-01 | Disposition: A | Discharge status: Home / Self Care | Source: Ambulatory Visit | Attending: Obstetrics and Gynecology | Admitting: Obstetrics and Gynecology

## 2023-12-01 DIAGNOSIS — Z113 Encounter for screening for infections with a predominantly sexual mode of transmission: Secondary | ICD-10-CM

## 2023-12-02 LAB — CERVICOVAGINAL ANCILLARY ONLY
Bacterial Vaginitis (gardnerella): NEGATIVE
Candida Glabrata: NEGATIVE
Candida Vaginitis: NEGATIVE
Chlamydia: NEGATIVE
Comment: NEGATIVE
Comment: NEGATIVE
Comment: NEGATIVE
Comment: NEGATIVE
Comment: NEGATIVE
Comment: NORMAL
Neisseria Gonorrhea: NEGATIVE
Trichomonas: NEGATIVE

## 2023-12-02 LAB — RPR: RPR Ser Ql: NONREACTIVE

## 2023-12-02 LAB — HEPATITIS C ANTIBODY: Hep C Virus Ab: NONREACTIVE

## 2023-12-02 LAB — HIV ANTIBODY (ROUTINE TESTING W REFLEX): HIV Screen 4th Generation wRfx: NONREACTIVE

## 2023-12-02 LAB — HEPATITIS B SURFACE ANTIGEN: Hepatitis B Surface Ag: NEGATIVE

## 2023-12-03 ENCOUNTER — Ambulatory Visit: Payer: Self-pay | Admitting: Obstetrics and Gynecology

## 2023-12-03 ENCOUNTER — Other Ambulatory Visit (HOSPITAL_COMMUNITY): Payer: Self-pay

## 2023-12-10 ENCOUNTER — Other Ambulatory Visit (HOSPITAL_COMMUNITY): Payer: Self-pay

## 2023-12-16 ENCOUNTER — Telehealth: Admitting: Adult Health

## 2023-12-16 ENCOUNTER — Encounter: Payer: Self-pay | Admitting: Adult Health

## 2023-12-16 ENCOUNTER — Other Ambulatory Visit: Payer: Self-pay

## 2023-12-16 ENCOUNTER — Other Ambulatory Visit (HOSPITAL_COMMUNITY): Payer: Self-pay

## 2023-12-16 DIAGNOSIS — F341 Dysthymic disorder: Secondary | ICD-10-CM | POA: Diagnosis not present

## 2023-12-16 DIAGNOSIS — G4700 Insomnia, unspecified: Secondary | ICD-10-CM

## 2023-12-16 DIAGNOSIS — F422 Mixed obsessional thoughts and acts: Secondary | ICD-10-CM

## 2023-12-16 MED ORDER — TRAZODONE HCL 100 MG PO TABS
100.0000 mg | ORAL_TABLET | Freq: Every day | ORAL | 3 refills | Status: AC
  Filled 2023-12-16 – 2024-01-02 (×2): qty 90, 90d supply, fill #0
  Filled 2024-03-03 – 2024-04-05 (×2): qty 90, 90d supply, fill #1

## 2023-12-16 MED ORDER — SERTRALINE HCL 100 MG PO TABS
200.0000 mg | ORAL_TABLET | Freq: Every day | ORAL | 3 refills | Status: AC
  Filled 2023-12-16: qty 180, 90d supply, fill #0
  Filled 2024-03-03: qty 180, 90d supply, fill #1

## 2023-12-16 MED ORDER — ARIPIPRAZOLE 2 MG PO TABS
2.0000 mg | ORAL_TABLET | Freq: Every day | ORAL | 3 refills | Status: AC
  Filled 2023-12-16: qty 90, 90d supply, fill #0
  Filled 2024-03-03: qty 90, 90d supply, fill #1

## 2023-12-16 NOTE — Progress Notes (Signed)
 April Williams [id number] 2000/12/14 23 y.o.  Virtual Visit via Video Note  I connected with pt @ on 12/16/23 at  1:30 PM EDT by a video enabled telemedicine application and verified that I am speaking with the correct person using two identifiers.   I discussed the limitations of evaluation and management by telemedicine and the availability of in person appointments. The patient expressed understanding and agreed to proceed.  I discussed the assessment and treatment plan with the patient. The patient was provided an opportunity to ask questions and all were answered. The patient agreed with the plan and demonstrated an understanding of the instructions.   The patient was advised to call back or seek an in-person evaluation if the symptoms worsen or if the condition fails to improve as anticipated.  I provided 15 minutes of non-face-to-face time during this encounter.  The patient was located at home.  The provider was located at [facility].   April LOISE Sayers, NP   Subjective:   Patient ID:  April Williams is a 23 y.o. (DOB 12-05-2000) female.  Chief Complaint: No chief complaint on file.   HPI April Williams presents for follow-up of persistent depressive disorder with melancholic features, currently moderate, mixed obsessional thoughts and acts, insomnia and avoidant-restrictive food intake disorder (ARFID).  Describes mood today as ok. Pleasant. Reports tearfulness at times. Mood symptoms - reports some depression, anxiety and irritability - nothing out of the ordinary. Reports improved interest and motivation. Denies panic attacks. Reports decreased over thinking, worry and rumination. Denies intrusive thoughts. Reports mood is more stable. Stating I feel like I'm doing better. Feels like current medication regimen is helpful. Taking medications as prescribed.  Energy levels stable - it's better. Active, does not have a regular exercise routine. Enjoys some  usual interests and activities. Single. Lives with mother. Sister lives 1.5 hours away - father and brother lives in [place] . Spending time with family. Appetite adequate. Weight stable. Reports sleeping well most nights. Averages 7 to 8 hours.  Focus and concentration stable. Completing some household tasks. Managing aspects of household. Taking 3 online classes at a community college - [employer].  Denies SI or HI.  Denies AH or VH. Denies self harm. Denies substance use.  Previous medication trials: Pristiq   Review of Systems:  Review of Systems  Musculoskeletal:  Negative for gait problem.  Neurological:  Negative for tremors.  Psychiatric/Behavioral:         Please refer to HPI    Medications: I have reviewed the patient's current medications.  Current Outpatient Medications  Medication Sig Dispense Refill   traZODone  (DESYREL ) 100 MG tablet Take 1 tablet (100 mg total) by mouth at bedtime. 30 tablet 1   ARIPiprazole  (ABILIFY ) 2 MG tablet Take 1 tablet (2 mg total) by mouth daily. 90 tablet 3   cholecalciferol (VITAMIN D ) 1000 units tablet Take 4,000 Units by mouth daily.     fludrocortisone  (FLORINEF ) 0.1 MG tablet Take 2 tablets (0.2 mg total) by mouth daily. 180 tablet 0   LO LOESTRIN FE  1 MG-10 MCG / 10 MCG tablet Take 1 tablet by mouth daily. 84 tablet 4   Melatonin 10 MG TABS Take by mouth.     meloxicam  (MOBIC ) 15 MG tablet Take 1 tablet (15 mg total) by mouth daily with food for inflammation 60 tablet 2   metoprolol  tartrate (LOPRESSOR ) 25 MG tablet Take 1 tablet (25 mg total) by mouth daily. 90 tablet 2   midodrine  (PROAMATINE ) 5  MG tablet Take 2 tablets (10 mg total) by mouth 3 (three) times daily. 540 tablet 3   senna (SENOKOT) 8.6 MG tablet Take 2 tablets by mouth daily.     sertraline  (ZOLOFT ) 100 MG tablet Take 2 tablets (200 mg total) by mouth daily. 180 tablet 3   traZODone  (DESYREL ) 50 MG tablet Take 1 tablet (50 mg total) by mouth at bedtime. 90 tablet 3    zonisamide  (ZONEGRAN ) 25 MG capsule Take 1 capsule daily for one week, then 2 capsules daily for one week, then 3 capsules daily for one week, then 4 capsules daily.  Contact office for refill. 120 capsule 0   No current facility-administered medications for this visit.    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    Irritable bowel syndrome    Lump of right breast 06/30/2018   Nasal polyps    Obsessive-compulsive disorder    Physical deconditioning 04/21/2018   Plantar wart    POTS (postural orthostatic tachycardia syndrome)    Scoliosis of thoracolumbar spine    Vitamin D  deficiency     Family History  Problem Relation Age of Onset   Depression Mother    OCD Mother    Obesity Mother    Asthma Brother    Stroke Maternal Uncle    Depression Maternal Grandmother    Diabetes Paternal Grandmother    Migraines Neg Hx    Anxiety disorder Neg Hx    Bipolar disorder Neg Hx    Schizophrenia Neg Hx    ADD / ADHD Neg Hx    Autism Neg Hx     Social History   Socioeconomic History   Marital status: Single    Spouse name: Not on file   Number of children: Not on file   Years of education: Not on file   Highest education level: 9th grade  Occupational History   Occupation: student  Tobacco Use   Smoking status: Never    Passive exposure: Never   Smokeless tobacco: Never  Vaping Use   Vaping status: Never Used  Substance and Sexual Activity   Alcohol use: Not Currently   Drug use: Never   Sexual activity: Never  Other  Topics Concern   Not on file  Social History Narrative   April Williams is at [employer] for dual enrollment with an associates in Retail buyer. She would like to be an Transport planner.       She lives with mother and goes to her father's house every other weekend. She has a Cat and a Hedgehog   Has been primarily with mother since Covid 19 restrictions.    She enjoys playing video games, sing, and sew.          06/15/2019: Self-directed in schoolwork generally A and B grades currently taking AP psychology planning considering herself undeserving for low grade or delayed assignment completion which is always B or better than average.  Patient wishes to have dual educational program next year in 11th grade with classes also at  [employer]. She has been inflexible and constricted in repertoire of nutrition and activity, though she can spontaneously enjoy social activity with friends as recent as a year ago as well as volunteering for special needs children to have Friday night activities before COVID.  Otherwise she has maintained only 1 friend though she enjoys the cat at home though having to clean after it.  Patient feels tormented by older sister who has a 54-month-old baby patient loves as with father or psychologist wanting her to gain weight to become healthy.   Social Drivers of Corporate investment banker Strain: Low Risk  (02/03/2023)   Overall Financial Resource Strain (CARDIA)    Difficulty of Paying Living Expenses: Not very hard  Food Insecurity: No Food Insecurity (02/03/2023)   Hunger Vital Sign    Worried About Running Out of Food in the Last Year: Never true    Ran Out of Food in the Last Year: Never true  Transportation Needs: No Transportation Needs (02/03/2023)   PRAPARE - Administrator, Civil Service (Medical): No    Lack of Transportation (Non-Medical): No  Physical Activity: Insufficiently Active (02/03/2023)   Exercise Vital Sign    Days of Exercise per Week: 1 day    Minutes of  Exercise per Session: 20 min  Stress: Stress Concern Present (02/03/2023)   [employer] of Occupational Health - Occupational Stress Questionnaire    Feeling of Stress : Rather much  Social Connections: Socially Isolated (02/03/2023)   Social Connection and Isolation Panel    Frequency of Communication with Friends and Family: More than three times a week    Frequency of Social Gatherings with Friends and Family: Never    Attends Religious Services: Never    Database administrator or Organizations: No    Attends Banker Meetings: Never    Marital Status: Never married  Intimate Partner Violence: Not At Risk (02/03/2023)   Humiliation, Afraid, Rape, and Kick questionnaire    Fear of Current or Ex-Partner: No    Emotionally Abused: No    Physically Abused: No    Sexually Abused: No    Past Medical History, Surgical history, Social history, and Family history were reviewed and updated as appropriate.   Please see review of systems for further details on the patient's review from today.   Objective:   Physical Exam:  There were no vitals taken for this visit.  Physical Exam Constitutional:      General: She is not in acute distress. Musculoskeletal:        General: No deformity.  Neurological:     Mental Status: She is alert and oriented to person, place, and time.     Coordination: Coordination normal.  Psychiatric:        Attention and Perception: Attention and perception normal. She does not perceive auditory or visual hallucinations.        Mood and Affect: Mood normal. Mood is not anxious or depressed. Affect is not labile, blunt, angry or inappropriate.        Speech: Speech normal.        Behavior: Behavior normal.        Thought Content: Thought content normal. Thought content is not paranoid or delusional. Thought content does not include homicidal or suicidal ideation. Thought content does not include homicidal or suicidal plan.        Cognition and  Memory: Cognition and memory normal.        Judgment:  Judgment normal.     Comments: Insight intact     Lab Review:     Component Value Date/Time   NA 135 05/27/2023 1630   K 3.9 05/27/2023 1630   CL 100 05/27/2023 1630   CO2 24 05/27/2023 1630   GLUCOSE 86 05/27/2023 1630   BUN 11 05/27/2023 1630   CREATININE 0.49 05/27/2023 1630   CREATININE 0.70 01/27/2019 1015   CALCIUM 9.3 05/27/2023 1630   PROT 7.2 01/27/2019 1015   ALBUMIN 4.1 12/07/2016 1402   AST 13 01/27/2019 1015   ALT 10 01/27/2019 1015   ALKPHOS 117 12/07/2016 1402   BILITOT 0.7 01/27/2019 1015   GFRNONAA >60 05/27/2023 1630   GFRAA NOT CALCULATED 12/07/2016 1402       Component Value Date/Time   WBC 10.7 (H) 05/27/2023 1630   RBC 5.36 (H) 05/27/2023 1630   HGB 14.7 05/27/2023 1630   HGB 15.6 (H) 10/28/2022 1236   HCT 43.3 05/27/2023 1630   PLT 351 05/27/2023 1630   PLT 456 (H) 10/28/2022 1236   MCV 80.8 05/27/2023 1630   MCH 27.4 05/27/2023 1630   MCHC 33.9 05/27/2023 1630   RDW 12.3 05/27/2023 1630   LYMPHSABS 1.9 10/28/2022 1236   MONOABS 0.7 10/28/2022 1236   EOSABS 0.1 10/28/2022 1236   BASOSABS 0.0 10/28/2022 1236    No results found for: POCLITH, LITHIUM   No results found for: PHENYTOIN, PHENOBARB, VALPROATE, CBMZ   .res Assessment: Plan:    Plan:  PDMP reviewed  1. Zoloft  200mg  daily 2. Abilify  2mg  daily  3. Trazadone 100mg  - 2 at hs   Consider Vraylar   RTC 6 months  15 minutes spent dedicated to the care of this patient on the date of this encounter to include pre-visit review of records, ordering of medication, post visit documentation, and face-to-face time with the patient discussing depression, anxiety and obsessional thoughts. Discussed continuing current medication regimen.  Discussed potential metabolic side effects associated with atypical antipsychotics, as well as potential risk for movement side effects. Advised pt to contact office if movement side  effects occur.    There are no diagnoses linked to this encounter.   Please see After Visit Summary for patient specific instructions.  Future Appointments  Date Time Provider Department Center  12/16/2023  1:30 PM Freeman Borba, April Mattocks, NP [facility] None  02/10/2024  9:15 AM Early, Camie BRAVO, NP [facility] [id number]  05/18/2024  8:50 AM Skeet Juliene SAUNDERS, DO [facility] None    No orders of the defined types were placed in this encounter.     -------------------------------

## 2024-01-02 ENCOUNTER — Other Ambulatory Visit: Payer: Self-pay

## 2024-01-02 ENCOUNTER — Other Ambulatory Visit (HOSPITAL_COMMUNITY): Payer: Self-pay

## 2024-02-10 ENCOUNTER — Ambulatory Visit (INDEPENDENT_AMBULATORY_CARE_PROVIDER_SITE_OTHER): Payer: 59 | Admitting: Nurse Practitioner

## 2024-02-10 ENCOUNTER — Other Ambulatory Visit (HOSPITAL_COMMUNITY): Payer: Self-pay

## 2024-02-10 ENCOUNTER — Encounter: Payer: Self-pay | Admitting: Nurse Practitioner

## 2024-02-10 VITALS — BP 114/70 | HR 92 | Ht 66.5 in | Wt 191.8 lb

## 2024-02-10 DIAGNOSIS — Z832 Family history of diseases of the blood and blood-forming organs and certain disorders involving the immune mechanism: Secondary | ICD-10-CM

## 2024-02-10 DIAGNOSIS — Z0000 Encounter for general adult medical examination without abnormal findings: Secondary | ICD-10-CM | POA: Diagnosis not present

## 2024-02-10 DIAGNOSIS — E538 Deficiency of other specified B group vitamins: Secondary | ICD-10-CM | POA: Diagnosis not present

## 2024-02-10 DIAGNOSIS — Z1321 Encounter for screening for nutritional disorder: Secondary | ICD-10-CM

## 2024-02-10 DIAGNOSIS — Z130 Encounter for screening for diseases of the blood and blood-forming organs and certain disorders involving the immune mechanism: Secondary | ICD-10-CM

## 2024-02-10 DIAGNOSIS — E611 Iron deficiency: Secondary | ICD-10-CM | POA: Diagnosis not present

## 2024-02-10 DIAGNOSIS — F4323 Adjustment disorder with mixed anxiety and depressed mood: Secondary | ICD-10-CM

## 2024-02-10 DIAGNOSIS — B354 Tinea corporis: Secondary | ICD-10-CM | POA: Diagnosis not present

## 2024-02-10 DIAGNOSIS — Z23 Encounter for immunization: Secondary | ICD-10-CM | POA: Diagnosis not present

## 2024-02-10 DIAGNOSIS — Z1329 Encounter for screening for other suspected endocrine disorder: Secondary | ICD-10-CM

## 2024-02-10 DIAGNOSIS — Z13228 Encounter for screening for other metabolic disorders: Secondary | ICD-10-CM

## 2024-02-10 DIAGNOSIS — G901 Familial dysautonomia [Riley-Day]: Secondary | ICD-10-CM | POA: Diagnosis not present

## 2024-02-10 MED ORDER — TERBINAFINE HCL 250 MG PO TABS
250.0000 mg | ORAL_TABLET | Freq: Every day | ORAL | 0 refills | Status: DC
  Filled 2024-02-10 (×2): qty 14, 14d supply, fill #0

## 2024-02-10 NOTE — Assessment & Plan Note (Signed)
 CPE completed today. Review of HM activities and recommendations discussed and provided on AVS. Anticipatory guidance, diet, and exercise recommendations provided. Medications, allergies, and hx reviewed and updated as necessary. Orders placed as listed below.  Plan: - Labs will be ordered after speaking with hematology. Will make changes as necessary based on results.  - I will review these results and send recommendations via MyChart or a telephone call.  - F/U with CPE in 1 year or sooner for acute/chronic health needs as directed.

## 2024-02-10 NOTE — Patient Instructions (Signed)
 I will get in touch with hematology to get an idea of the lab tests that we need to do and will place the order for that. You can come back here or have them done at any [facility] location. I will reach out to you to let you know when these orders are in.      For all adult patients, I recommend A well balanced diet low in saturated fats, cholesterol, and moderation in carbohydrates.   This can be as simple as monitoring portion sizes and cutting back on sugary beverages such as soda and juice to start with.    Daily water consumption of at least 64 ounces.  Physical activity at least 180 minutes per week, if just starting out.   This can be as simple as taking the stairs instead of the elevator and walking 2-3 laps around the office  purposefully every day.   STD protection, partner selection, and regular testing if high risk.  Limited consumption of alcoholic beverages if alcohol is consumed.  For women, I recommend no more than 7 alcoholic beverages per week, spread out throughout the week.  Avoid binge drinking or consuming large quantities of alcohol in one setting.   Please let me know if you feel you may need help with reduction or quitting alcohol consumption.   Avoidance of nicotine, if used.  Please let me know if you feel you may need help with reduction or quitting nicotine use.   Daily mental health attention.  This can be in the form of 5 minute daily meditation, prayer, journaling, yoga, reflection, etc.   Purposeful attention to your emotions and mental state can significantly improve your overall wellbeing  and  Health.  Please know that I am here to help you with all of your health care goals and am happy to work with you to find a solution that works best for you.  The greatest advice I have received with any changes in life are to take it one step at a time, that even means if all you can focus on is the next 60 seconds, then do that and celebrate your victories.   With any changes in life, you will have set backs, and that is OK. The important thing to remember is, if you have a set back, it is not a failure, it is an opportunity to try again!  Health Maintenance Recommendations Screening Testing Mammogram Every 1 -2 years based on history and risk factors Starting at age 42 Pap Smear Ages 21-39 every 3 years Ages 61-65 every 5 years with HPV testing More frequent testing may be required based on results and history Colon Cancer Screening Every 1-10 years based on test performed, risk factors, and history Starting at age 76 Bone Density Screening Every 2-10 years based on history Starting at age 40 for women Recommendations for men differ based on medication usage, history, and risk factors AAA Screening One time ultrasound Men 43-24 years old who have every smoked Lung Cancer Screening Low Dose Lung CT every 12 months Age 4-80 years with a 30 pack-year smoking history who still smoke or who have quit within the last 15 years  Screening Labs Routine  Labs: Complete Blood Count (CBC), Complete Metabolic Panel (CMP), Cholesterol (Lipid Panel) Every 6-12 months based on history and medications May be recommended more frequently based on current conditions or previous results Hemoglobin A1c Lab Every 3-12 months based on history and previous results Starting at age 38 or earlier  with diagnosis of diabetes, high cholesterol, BMI >26, and/or risk factors Frequent monitoring for patients with diabetes to ensure blood sugar control Thyroid  Panel (TSH w/ T3 & T4) Every 6 months based on history, symptoms, and risk factors May be repeated more often if on medication HIV One time testing for all patients 68 and older May be repeated more frequently for patients with increased risk factors or exposure Hepatitis C One time testing for all patients 78 and older May be repeated more frequently for patients with increased risk factors or  exposure Gonorrhea, Chlamydia Every 12 months for all sexually active persons 13-24 years Additional monitoring may be recommended for those who are considered high risk or who have symptoms PSA Men 56-24 years old with risk factors Additional screening may be recommended from age 39-69 based on risk factors, symptoms, and history  Vaccine Recommendations Tetanus Booster All adults every 10 years Flu Vaccine All patients 6 months and older every year COVID Vaccine All patients 12 years and older Initial dosing with booster May recommend additional booster based on age and health history HPV Vaccine 2 doses all patients age 80-26 Dosing may be considered for patients over 26 Shingles Vaccine (Shingrix) 2 doses all adults 55 years and older Pneumonia (Pneumovax 23) All adults 65 years and older May recommend earlier dosing based on health history Pneumonia (Prevnar 26) All adults 65 years and older Dosed 1 year after Pneumovax 23  Additional Screening, Testing, and Vaccinations may be recommended on an individualized basis based on family history, health history, risk factors, and/or exposure.

## 2024-02-10 NOTE — Assessment & Plan Note (Signed)
 Ongoing management of dysautonomia with symptoms of fatigue and temperature dysregulation. Recent flu vaccinations received. Neurological evaluation ongoing with plans for follow-up in February. Previous ED visit for numbness and tingling, with negative findings for blood clots and Chiari malformation. Discussion of potential blood clotting disorders due to family history and previous hematology consultation. - Continue current management and monitoring of symptoms. - Follow up with neurologist in February. - Will coordinate with hematology for potential clotting disorder testing, including MTHFR gene testing

## 2024-02-10 NOTE — Progress Notes (Unsigned)
 [name], DNP, AGNP-c [facility] [address] [place], [place] [address] Main Office [phone] VISIT TYPE: CPE on 02/10/2024 Today's Vitals   02/10/24 0849  BP: 114/70  Pulse: 92  Weight: 191 lb 12.8 oz (87 kg)  Height: 5' 6.5 (1.689 m)   Body mass index is 30.49 kg/m. BP 114/70   Pulse 92   Ht 5' 6.5 (1.689 m)   Wt 191 lb 12.8 oz (87 kg)   LMP 02/08/2024   BMI 30.49 kg/m   Subjective:    Patient ID: April Williams, female    DOB: March 12, 2001, 23 y.o.   MRN: [id number]  HPI:  History of Present Illness April Williams is a 23 year old female who presents for an annual physical exam and evaluation of a persistent skin infection. She is accompanied by her mother.  She has been experiencing a persistent skin infection for approximately four weeks, initially starting as a single spot and spreading to multiple areas. Despite using zinc oxide, triamcinolone, Lotrimin AF, and Monistat, the infection has not fully resolved. This episode is more severe than previous ones.  She has a history of dysautonomia. The main lingering symptom is fatigue, which varies with her activity level. After extended periods of activity, she experiences significant fatigue and sometimes profuse sweating.  Her menstrual cycles are regular, but she experiences prolonged spotting. Previously, she was bleeding twice a month, which has improved with low-dose estrogen therapy, although spotting persists. She reports reduced cramping and pain since starting the estrogen therapy.  There is a family history of blood clotting disorders, with multiple family members, including her father and brother, having experienced strokes. She has previously consulted a hematologist regarding this issue due to concerns about potential clotting disorders.  She is currently taking metoprolol  and a low-dose estrogen medication. She has not taken her Benadryl today due to fasting for the  appointment.  Pertinent items are noted in HPI.  Most Recent Depression Screen:     02/10/2024    8:47 AM 02/03/2023    8:49 AM 09/20/2021    4:00 PM 04/07/2018    4:24 PM 09/03/2017   11:08 AM  Depression screen PHQ 2/9  Decreased Interest 2 1 1  0 0  Down, Depressed, Hopeless 1 1 2 2  0  PHQ - 2 Score 3 2 3 2  0  Altered sleeping 1 1 0 0 1  Tired, decreased energy 2 0 2 2 3   Change in appetite 0 0 1 0 0  Feeling bad or failure about yourself  2  1 1  0  Trouble concentrating 0 0 0 2 3  Moving slowly or fidgety/restless 0 0 0 0 2  Suicidal thoughts 1 0 1    PHQ-9 Score 9 3  8  7  9    Difficult doing work/chores Somewhat difficult Not difficult at all        Data saved with a previous flowsheet row definition   Most Recent Anxiety Screen:     09/20/2021    4:00 PM 04/07/2018    4:24 PM 09/03/2017   11:08 AM 08/01/2017   10:56 AM  GAD 7 : Generalized Anxiety Score  Nervous, Anxious, on Edge 1 2 1 1   Control/stop worrying 1 2 0 1  Worry too much - different things 1 1 0 2  Trouble relaxing 0 0 0 0  Restless 0 0 0 1  Easily annoyed or irritable 1 0 0 0  Afraid - awful might happen 0  0 0 0  Total GAD 7 Score 4 5 1 5    Most Recent Fall Screen:    02/10/2024    8:47 AM 09/16/2023   11:03 AM 02/03/2023    8:49 AM 01/30/2022    8:31 AM  Fall Risk   Falls in the past year? 0 0 0 0  Number falls in past yr: 0 0 0 0  Injury with Fall? 0 0 0 0  Risk for fall due to : No Fall Risks No Fall Risks No Fall Risks No Fall Risks  Follow up Falls evaluation completed  Falls evaluation completed Falls evaluation completed;Education provided      Data saved with a previous flowsheet row definition    Past medical history, surgical history, medications, allergies, family history and social history reviewed with patient today and changes made to appropriate areas of the chart.  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   cholecalciferol (VITAMIN D ) 1000 units tablet Take 4,000 Units by mouth daily.   fludrocortisone  (FLORINEF ) 0.1 MG tablet Take 2 tablets (0.2 mg total) by mouth daily.   LO LOESTRIN FE  1 MG-10 MCG / 10 MCG tablet Take 1 tablet by mouth daily.   Melatonin 10 MG TABS Take by mouth.   metoprolol  tartrate (LOPRESSOR ) 25 MG tablet Take 1 tablet (25 mg total) by mouth daily.   midodrine  (PROAMATINE ) 5 MG tablet Take 2 tablets (10 mg total) by mouth 3 (three) times daily.   senna (SENOKOT) 8.6 MG tablet Take 2 tablets by mouth daily.   sertraline  (ZOLOFT ) 100 MG tablet Take 2 tablets (200 mg total) by mouth daily.   traZODone  (DESYREL ) 100 MG tablet Take 1 tablet (100 mg total) by mouth at bedtime.   traZODone  (DESYREL ) 50 MG tablet Take 1 tablet (50 mg total) by mouth at bedtime.   zonisamide  (ZONEGRAN ) 25 MG capsule Take 1 capsule daily for one week, then 2 capsules daily for one week, then 3 capsules daily for one week, then 4 capsules daily.  Contact office for refill. (Patient not taking: Reported on 02/10/2024)   No current facility-administered medications on file prior to visit.   Surgical History:  Past  Surgical History:  Procedure Laterality Date   NASAL SINUS SURGERY  06/12/2017   wisdom tooth removal     Allergies:  No Known Allergies Family History:  Family History  Problem Relation Age of Onset   Depression Mother    OCD Mother    Obesity Mother    Asthma Brother    Stroke Maternal  Uncle    Depression Maternal Grandmother    Diabetes Paternal Grandmother    Migraines Neg Hx    Anxiety disorder Neg Hx    Bipolar disorder Neg Hx    Schizophrenia Neg Hx    ADD / ADHD Neg Hx    Autism Neg Hx        Objective:    BP 114/70   Pulse 92   Ht 5' 6.5 (1.689 m)   Wt 191 lb 12.8 oz (87 kg)   LMP 02/08/2024   BMI 30.49 kg/m   Wt Readings from Last 3 Encounters:  02/10/24 191 lb 12.8 oz (87 kg)  09/16/23 181 lb (82.1 kg)  09/16/23 181 lb (82.1 kg)    Physical Exam Vitals and nursing note reviewed.  Constitutional:      General: She is not in acute distress.    Appearance: Normal appearance.  HENT:     Head: Normocephalic and atraumatic.     Right Ear: Hearing, tympanic membrane, ear canal and external ear normal.     Left Ear: Hearing, tympanic membrane, ear canal and external ear normal.     Nose: Nose normal.     Right Sinus: No maxillary sinus tenderness or frontal sinus tenderness.     Left Sinus: No maxillary sinus tenderness or frontal sinus tenderness.     Mouth/Throat:     Lips: Pink.     Mouth: Mucous membranes are moist.     Pharynx: Oropharynx is clear.  Eyes:     General: Lids are normal. Vision grossly intact.     Extraocular Movements: Extraocular movements intact.     Conjunctiva/sclera: Conjunctivae normal.     Pupils: Pupils are equal, round, and reactive to light.     Funduscopic exam:    Right eye: Red reflex present.        Left eye: Red reflex present.    Visual Fields: Right eye visual fields normal and left eye visual fields normal.  Neck:     Thyroid : No thyromegaly.     Vascular: No carotid bruit.  Cardiovascular:     Rate and  Rhythm: Normal rate and regular rhythm.     Chest Wall: PMI is not displaced.     Pulses: Normal pulses.          Dorsalis pedis pulses are 2+ on the right side and 2+ on the left side.       Posterior tibial pulses are 2+ on the right side and 2+ on the left side.     Heart sounds: Normal heart sounds. No murmur heard. Pulmonary:     Effort: Pulmonary effort is normal. No respiratory distress.     Breath sounds: Normal breath sounds.  Abdominal:     General: Abdomen is flat. Bowel sounds are normal. There is no distension.     Palpations: Abdomen is soft. There is no hepatomegaly, splenomegaly or mass.     Tenderness: There is no abdominal tenderness. There is no right CVA tenderness, left CVA tenderness, guarding or rebound.  Musculoskeletal:        General: Normal range of motion.     Cervical back: Full passive range of motion without pain, normal range of motion and neck supple. No tenderness.     Right lower leg: No edema.     Left lower leg: No edema.  Feet:     Left foot:     Toenail Condition: Left toenails are normal.  Lymphadenopathy:  Cervical: No cervical adenopathy.     Upper Body:     Right upper body: No supraclavicular adenopathy.     Left upper body: No supraclavicular adenopathy.  Skin:    General: Skin is warm and dry.     Capillary Refill: Capillary refill takes less than 2 seconds.     Coloration: Skin is pale.     Nails: There is no clubbing.      Neurological:     General: No focal deficit present.     Mental Status: She is alert and oriented to person, place, and time.     GCS: GCS eye subscore is 4. GCS verbal subscore is 5. GCS motor subscore is 6.     Sensory: Sensation is intact.     Motor: Motor function is intact.     Coordination: Coordination is intact.     Gait: Gait is intact.     Deep Tendon Reflexes: Reflexes are normal and symmetric.  Psychiatric:        Attention and Perception: Attention normal.        Mood and Affect: Mood normal.         Speech: Speech normal.        Behavior: Behavior normal. Behavior is cooperative.        Thought Content: Thought content normal.        Cognition and Memory: Cognition and memory normal.        Judgment: Judgment normal.      Results for orders placed or performed in visit on 12/01/23  Cervicovaginal ancillary only( [facility])   Collection Time: 12/01/23 12:58 PM  Result Value Ref Range   Neisseria Gonorrhea Negative    Chlamydia Negative    Trichomonas Negative    Bacterial Vaginitis (gardnerella) Negative    Candida Vaginitis Negative    Candida Glabrata Negative    Comment Normal Reference Ranger Chlamydia - Negative    Comment      Normal Reference Range Neisseria Gonorrhea - Negative   Comment Normal Reference Range Candida Species - Negative    Comment Normal Reference Range Candida Galbrata - Negative    Comment Normal Reference Range Trichomonas - Negative    Comment      Normal Reference Range Bacterial Vaginosis - Negative  HIV antibody (with reflex)   Collection Time: 12/01/23  1:57 PM  Result Value Ref Range   HIV Screen 4th Generation wRfx Non Reactive Non Reactive  RPR   Collection Time: 12/01/23  1:57 PM  Result Value Ref Range   RPR Ser Ql Non Reactive Non Reactive  Hepatitis C Antibody   Collection Time: 12/01/23  1:57 PM  Result Value Ref Range   Hep C Virus Ab Non Reactive Non Reactive  Hepatitis B Surface AntiGEN   Collection Time: 12/01/23  1:57 PM  Result Value Ref Range   Hepatitis B Surface Ag Negative Negative       Assessment & Plan:   Problem List Items Addressed This Visit     Dysautonomia (HCC)   Ongoing management of dysautonomia with symptoms of fatigue and temperature dysregulation. Recent flu vaccinations received. Neurological evaluation ongoing with plans for follow-up in February. Previous ED visit for numbness and tingling, with negative findings for blood clots and Chiari malformation. Discussion of potential blood  clotting disorders due to family history and previous hematology consultation. - Continue current management and monitoring of symptoms. - Follow up with neurologist in February. - Will coordinate with hematology for  potential clotting disorder testing, including MTHFR gene testing      Adjustment disorder with mixed anxiety and depressed mood   Mood reported as stable/consistent. She declines any new or worsening symptoms or need for escalation of care at this time. Discussed the importance of mental health support and to reach out immediately if she feels her mental health is wavering in any way. Counseling and psychiatry treatment in place. No changes.        Encounter for annual physical exam - Primary   CPE completed today. Review of HM activities and recommendations discussed and provided on AVS. Anticipatory guidance, diet, and exercise recommendations provided. Medications, allergies, and hx reviewed and updated as necessary. Orders placed as listed below.  Plan: - Labs will be ordered after speaking with hematology. Will make changes as necessary based on results.  - I will review these results and send recommendations via MyChart or a telephone call.  - F/U with CPE in 1 year or sooner for acute/chronic health needs as directed.        Iron deficiency   Repeat labs in near future for monitoring.       Ringworm of body   Persistent tinea corporis with multiple lesions, unresponsive to topical treatments. Lesions are spreading and have been present for weeks. Previous treatments with triamcinolone and zinc oxide were ineffective. Current treatment with Lotrimin AF and Monistat has shown partial improvement but new lesions have appeared. - Prescribed oral terbinafine for 1-2 weeks, with the option to stop after one week if lesions resolve. - Continue using topical antifungal treatments as needed. - Consider using Telfa pads instead of Band-Aids to reduce skin irritation.       Relevant Medications   terbinafine (LAMISIL) 250 MG tablet   Other Visit Diagnoses       Need for influenza vaccination       Relevant Orders   Flu vaccine trivalent PF, 6mos and older(Flulaval,Afluria,Fluarix,Fluzone) (Completed)       Follow up plan: Return in about 1 year (around 02/09/2025) for CPE.  NEXT PREVENTATIVE PHYSICAL DUE IN 1 YEAR.  PATIENT COUNSELING PROVIDED FOR ALL ADULT PATIENTS: A well balanced diet low in saturated fats, cholesterol, and moderation in carbohydrates.  This can be as simple as monitoring portion sizes and cutting back on sugary beverages such as soda and juice to start with.    Daily water consumption of at least 64 ounces.  Physical activity at least 180 minutes per week.  If just starting out, start 10 minutes a day and work your way up.   This can be as simple as taking the stairs instead of the elevator and walking 2-3 laps around the office  purposefully every day.   STD protection, partner selection, and regular testing if high risk.  Limited consumption of alcoholic beverages if alcohol is consumed. For men, I recommend no more than 14 alcoholic beverages per week, spread out throughout the week (max 2 per day). Avoid binge drinking or consuming large quantities of alcohol in one setting.  Please let me know if you feel you may need help with reduction or quitting alcohol consumption.   Avoidance of nicotine, if used. Please let me know if you feel you may need help with reduction or quitting nicotine use.   Daily mental health attention. This can be in the form of 5 minute daily meditation, prayer, journaling, yoga, reflection, etc.  Purposeful attention to your emotions and mental state can significantly improve your overall  wellbeing  and  Health.  Please know that I am here to help you with all of your health care goals and am happy to work with you to find a solution that works best for you.  The greatest advice I have  received with any changes in life are to take it one step at a time, that even means if all you can focus on is the next 60 seconds, then do that and celebrate your victories.  With any changes in life, you will have set backs, and that is OK. The important thing to remember is, if you have a set back, it is not a failure, it is an opportunity to try again! Screening Testing Mammogram Every 1 -2 years based on history and risk factors Starting at age 58 Pap Smear Ages 21-39 every 3 years Ages 43-65 every 5 years with HPV testing More frequent testing may be required based on results and history Colon Cancer Screening Every 1-10 years based on test performed, risk factors, and history Starting at age 58 Bone Density Screening Every 2-10 years based on history Starting at age 57 for women Recommendations for men differ based on medication usage, history, and risk factors AAA Screening One time ultrasound Men 53-75 years old who have every smoked Lung Cancer Screening Low Dose Lung CT every 12 months Age 33-80 years with a 30 pack-year smoking history who still smoke or who have quit within the last 15 years   Screening Labs Routine  Labs: Complete Blood Count (CBC), Complete Metabolic Panel (CMP), Cholesterol (Lipid Panel) Every 6-12 months based on history and medications May be recommended more frequently based on current conditions or previous results Hemoglobin A1c Lab Every 3-12 months based on history and previous results Starting at age 48 or earlier with diagnosis of diabetes, high cholesterol, BMI >26, and/or risk factors Frequent monitoring for patients with diabetes to ensure blood sugar control Thyroid  Panel (TSH) Every 6 months based on history, symptoms, and risk factors May be repeated more often if on medication HIV One time testing for all patients 86 and older May be repeated more frequently for patients with increased risk factors or exposure Hepatitis C One  time testing for all patients 32 and older May be repeated more frequently for patients with increased risk factors or exposure Gonorrhea, Chlamydia Every 12 months for all sexually active persons 13-24 years Additional monitoring may be recommended for those who are considered high risk or who have symptoms Every 12 months for any woman on birth control, regardless of sexual activity PSA Men 60-40 years old with risk factors Additional screening may be recommended from age 323-69 based on risk factors, symptoms, and history  Vaccine Recommendations Tetanus Booster All adults every 10 years Flu Vaccine All patients 6 months and older every year COVID Vaccine All patients 12 years and older Initial dosing with booster May recommend additional booster based on age and health history HPV Vaccine 2 doses all patients age 32-26 Dosing may be considered for patients over 26 Shingles Vaccine (Shingrix) 2 doses all adults 55 years and older Pneumonia (Pneumovax 15) All adults 65 years and older May recommend earlier dosing based on health history One year apart from Prevnar 28 Pneumonia (Prevnar 6) All adults 65 years and older Dosed 1 year after Pneumovax 23 Pneumonia (Prevnar 20) One time alternative to the two dosing of 13 and 23 For all adults with initial dose of 23, 20 is recommended 1 year later For all  adults with initial dose of 13, 23 is still recommended as second option 1 year later

## 2024-02-10 NOTE — Assessment & Plan Note (Signed)
 Repeat labs in near future for monitoring.

## 2024-02-10 NOTE — Assessment & Plan Note (Signed)
 Mood reported as stable/consistent. She declines any new or worsening symptoms or need for escalation of care at this time. Discussed the importance of mental health support and to reach out immediately if she feels her mental health is wavering in any way. Counseling and psychiatry treatment in place. No changes.

## 2024-02-10 NOTE — Assessment & Plan Note (Signed)
 Persistent tinea corporis with multiple lesions, unresponsive to topical treatments. Lesions are spreading and have been present for weeks. Previous treatments with triamcinolone and zinc oxide were ineffective. Current treatment with Lotrimin AF and Monistat has shown partial improvement but new lesions have appeared. - Prescribed oral terbinafine for 1-2 weeks, with the option to stop after one week if lesions resolve. - Continue using topical antifungal treatments as needed. - Consider using Telfa pads instead of Band-Aids to reduce skin irritation.

## 2024-02-12 ENCOUNTER — Encounter: Payer: Self-pay | Admitting: Nurse Practitioner

## 2024-02-13 ENCOUNTER — Other Ambulatory Visit: Payer: Self-pay

## 2024-02-13 DIAGNOSIS — E611 Iron deficiency: Secondary | ICD-10-CM

## 2024-02-13 DIAGNOSIS — E538 Deficiency of other specified B group vitamins: Secondary | ICD-10-CM

## 2024-02-13 DIAGNOSIS — Z0000 Encounter for general adult medical examination without abnormal findings: Secondary | ICD-10-CM

## 2024-02-13 DIAGNOSIS — G901 Familial dysautonomia [Riley-Day]: Secondary | ICD-10-CM

## 2024-02-13 DIAGNOSIS — Z130 Encounter for screening for diseases of the blood and blood-forming organs and certain disorders involving the immune mechanism: Secondary | ICD-10-CM

## 2024-02-13 DIAGNOSIS — Z832 Family history of diseases of the blood and blood-forming organs and certain disorders involving the immune mechanism: Secondary | ICD-10-CM

## 2024-02-13 DIAGNOSIS — F4323 Adjustment disorder with mixed anxiety and depressed mood: Secondary | ICD-10-CM

## 2024-02-19 ENCOUNTER — Encounter: Payer: Self-pay | Admitting: Nurse Practitioner

## 2024-03-03 ENCOUNTER — Other Ambulatory Visit: Payer: Self-pay

## 2024-03-03 ENCOUNTER — Other Ambulatory Visit (HOSPITAL_COMMUNITY): Payer: Self-pay

## 2024-03-05 ENCOUNTER — Ambulatory Visit: Payer: Self-pay

## 2024-03-05 ENCOUNTER — Encounter: Payer: Self-pay | Admitting: Nurse Practitioner

## 2024-03-05 NOTE — Telephone Encounter (Signed)
 FYI Only or Action Required?: FYI only for provider: appointment scheduled on 03/08/24.  Patient was last seen in primary care on 02/10/2024 by Early, Camie BRAVO, NP.  Called Nurse Triage reporting Rash.  Symptoms began several weeks ago.  Interventions attempted: OTC medications: Differin;steroid cream.  Symptoms are: gradually worsening.  Triage Disposition: See PCP When Office is Open (Within 3 Days) (overriding Call PCP Within 24 Hours)  Patient/caregiver understands and will follow disposition?: Yes                Message from Delon HERO sent at 03/05/2024  2:48 PM EST  Reason for Triage: Patient's mother is calling to report red rash for 3 week. No swelling, no itching. But it is getting worse.   Reason for Disposition  [1] Applying cream or ointment AND [2] causes severe itch, burning or pain  Answer Assessment - Initial Assessment Questions 1. APPEARANCE of RASH: What does the rash look like? (e.g., blisters, dry flaky skin, red spots, redness, sores)     Red, some small pustules (few) noted today.  2. LOCATION: Where is the rash located?      Chin, cheeks, forehead between brows. 3. NUMBER: How many spots are there?      4. 4. SIZE: How big are the spots? (e.g., inches, cm; or compare to size of pinhead, tip of pen, eraser, pea)      2.5x2.5 inches on cheeks, 1 in x 1 in on chin and between brows. 5. ONSET: When did the rash start?      3 weeks ago, gradually worsening.  6. ITCHING: Does the rash itch? If Yes, ask: How bad is the itch?  (Scale 0-10; or none, mild, moderate, severe)     No, she states it is more of a mild tingling since applying topical steroid cream.  7. PAIN: Does the rash hurt? If Yes, ask: How bad is the pain?  (Scale 0-10; or none, mild, moderate, severe)     No.  8. OTHER SYMPTOMS: Do you have any other symptoms? (e.g., fever)     No difficulty breathing, difficulty swallowing, fever, warmth, tick bites or pus  drainage.  9. PREGNANCY: Is there any chance you are pregnant? When was your last menstrual period?     Did not assess.  Protocols used: Rash or Redness - Localized-A-AH

## 2024-03-08 ENCOUNTER — Ambulatory Visit: Admitting: Family Medicine

## 2024-03-08 ENCOUNTER — Encounter: Payer: Self-pay | Admitting: Family Medicine

## 2024-03-08 VITALS — BP 106/68 | HR 81 | Ht 66.0 in | Wt 191.6 lb

## 2024-03-08 DIAGNOSIS — R21 Rash and other nonspecific skin eruption: Secondary | ICD-10-CM

## 2024-03-08 NOTE — Patient Instructions (Signed)
 Stay well hydrated Stop the cetaphil wash. Don't use any other body washes. Wash your face up to twice daily with a gentle soap (Dove, Oil of Alta, or a Dial). Use a gentle moisturizer (ie CerAve), without scent. If there is still a lot of rash (redness, flaking), you can try a 1% hydrocortisone cream up to twice daily, just to the affected areas of skin. Do not use any Triamcinolone or stronger steroids on the face.  Differin may tried in the future for acne, but leave it off for now.

## 2024-03-08 NOTE — Progress Notes (Signed)
 Chief Complaint  Patient presents with   other    Red rash started over a month ago, has used triamcinolone cream, and cetaphil face wash and has helped some but not much   She is accompanied by her mother today.  They are requesting labs to be drawn, has orders from [place] in the system.  Rash started shortly after last visit. Only med change was being prescribed lamisil  for chronic ringworm. She has completed 2 week course, but rash isn't resolving since completing the medication. When they called on Friday, rash was brighter red.  She had used triamcinolone cream on it that day, only use it once.  Got much redder after using it.  When it first started, she thought it was an acne flare related to her cycle, but the rash didn't resolve. She used some differin on it early on, when she thought it was acne.  Photos in chart reviewed-- Early photos--some redness, a few pustules.  Photos sent 12/12 had pinker face, this was after applying TAC.  Only used TAC once, on 12/12. Has been using cetaphil wash on face, nothing else topically.  She doesn't normally use this wash, just sporadically. Doesn't wear makeup.  Rash is not itchy. Denies any flushing. Rash only started a month ago, no prior issues with facial rashes.  Since Friday, when they made the appointment, they reported that it had faded, but color (pinkness) is starting to return at cheeks. Now notes more dry/flaky on her cheeks. Still denies any itching.   PMH, PSH, SH reviewed  Depression, dysautonomia/POTS  Outpatient Encounter Medications as of 03/08/2024  Medication Sig   ARIPiprazole  (ABILIFY ) 2 MG tablet Take 1 tablet (2 mg total) by mouth daily.   cholecalciferol (VITAMIN D ) 1000 units tablet Take 4,000 Units by mouth daily.   fludrocortisone  (FLORINEF ) 0.1 MG tablet Take 2 tablets (0.2 mg total) by mouth daily.   LO LOESTRIN FE  1 MG-10 MCG / 10 MCG tablet Take 1 tablet by mouth daily.   Melatonin 10 MG TABS Take by  mouth.   metoprolol  tartrate (LOPRESSOR ) 25 MG tablet Take 1 tablet (25 mg total) by mouth daily.   midodrine  (PROAMATINE ) 5 MG tablet Take 2 tablets (10 mg total) by mouth 3 (three) times daily.   senna (SENOKOT) 8.6 MG tablet Take 2 tablets by mouth daily.   sertraline  (ZOLOFT ) 100 MG tablet Take 2 tablets (200 mg total) by mouth daily.   traZODone  (DESYREL ) 100 MG tablet Take 1 tablet (100 mg total) by mouth at bedtime.   terbinafine  (LAMISIL ) 250 MG tablet Take 1 tablet (250 mg total) by mouth daily. (Patient not taking: Reported on 03/08/2024)   [DISCONTINUED] traZODone  (DESYREL ) 50 MG tablet Take 1 tablet (50 mg total) by mouth at bedtime. (Patient not taking: Reported on 03/08/2024)   [DISCONTINUED] zonisamide  (ZONEGRAN ) 25 MG capsule Take 1 capsule daily for one week, then 2 capsules daily for one week, then 3 capsules daily for one week, then 4 capsules daily.  Contact office for refill. (Patient not taking: Reported on 03/08/2024)   No facility-administered encounter medications on file as of 03/08/2024.   Allergies[1]  ROS: no f/c, no itching, no rashes elsewhere. No other complaints except as noted above.    PHYSICAL EXAM:  BP 106/68   Pulse 81   Ht 5' 6 (1.676 m)   Wt 191 lb 9.6 oz (86.9 kg)   LMP 02/08/2024   SpO2 97%   BMI 30.93 kg/m   Well-appearing, pleasant  female, in no distress HEENT: conjunctiva and sclera are clear, EOMI. Lips are very dry, but not cracked. Cheeks--slight fine white flaking noted, but fairly smooth to the touch, not rough.   A few very small pustules are present on the cheeks Slight pink on the cheeks only, and between eyes Nasolabial folds are clear  Neck: one tender, shotty LN at L neck/jaw (pt states related to a pimple in her ear), no other lymphadenopathy Heart: regular rate and rhythm Lungs: clear bilaterally   ASSESSMENT/PLAN:  Rash of face - ?poss triggered by lamisil  (timing), but not improved upon completion. Moisturize.  HC BID prn, f/u if persists/worsens  Ddx reviewed.  History is not c/w rosacea. May have an acne component. Doesn't appear to have autoimmune etiology, seems more acute.   Stay well hydrated Stop the cetaphil wash. Don't use any other body washes. Wash your face up to twice daily with a gentle soap (Dove, Oil of [place], or a Dial). Use a gentle moisturizer (ie CerAve), without scent. If there is still a lot of rash (redness, flaking), you can try a 1% hydrocortisone cream up to twice daily, just to the affected areas of skin. Do not use any Triamcinolone or stronger steroids on the face.  Differin may tried in the future for acne, but leave it off for now.    [1] No Known Allergies

## 2024-03-10 ENCOUNTER — Ambulatory Visit: Payer: Self-pay | Admitting: Nurse Practitioner

## 2024-03-10 DIAGNOSIS — E7212 Methylenetetrahydrofolate reductase deficiency: Secondary | ICD-10-CM

## 2024-03-15 LAB — COMPREHENSIVE METABOLIC PANEL WITH GFR
ALT: 12 IU/L (ref 0–32)
AST: 14 IU/L (ref 0–40)
Albumin: 4.4 g/dL (ref 4.0–5.0)
Alkaline Phosphatase: 122 IU/L — ABNORMAL HIGH (ref 41–116)
BUN/Creatinine Ratio: 13 (ref 9–23)
BUN: 8 mg/dL (ref 6–20)
Bilirubin Total: 0.3 mg/dL (ref 0.0–1.2)
CO2: 20 mmol/L (ref 20–29)
Calcium: 9.8 mg/dL (ref 8.7–10.2)
Chloride: 102 mmol/L (ref 96–106)
Creatinine, Ser: 0.63 mg/dL (ref 0.57–1.00)
Globulin, Total: 2.5 g/dL (ref 1.5–4.5)
Glucose: 85 mg/dL (ref 70–99)
Potassium: 4.5 mmol/L (ref 3.5–5.2)
Sodium: 139 mmol/L (ref 134–144)
Total Protein: 6.9 g/dL (ref 6.0–8.5)
eGFR: 129 mL/min/1.73 (ref >59)

## 2024-03-15 LAB — IRON,TIBC AND FERRITIN PANEL
Ferritin: 121 ng/mL (ref 15–150)
Iron Saturation: 32 % (ref 15–55)
Iron: 114 ug/dL (ref 27–159)
Total Iron Binding Capacity: 361 ug/dL (ref 250–450)
UIBC: 247 ug/dL (ref 131–425)

## 2024-03-15 LAB — CBC WITH DIFFERENTIAL/PLATELET
Basophils Absolute: 0.1 x10E3/uL (ref 0.0–0.2)
Basos: 1 %
EOS (ABSOLUTE): 0.1 x10E3/uL (ref 0.0–0.4)
Eos: 1 %
Hematocrit: 44.8 % (ref 34.0–46.6)
Hemoglobin: 14.5 g/dL (ref 11.1–15.9)
Immature Grans (Abs): 0 x10E3/uL (ref 0.0–0.1)
Immature Granulocytes: 0 %
Lymphocytes Absolute: 2 x10E3/uL (ref 0.7–3.1)
Lymphs: 27 %
MCH: 27.2 pg (ref 26.6–33.0)
MCHC: 32.4 g/dL (ref 31.5–35.7)
MCV: 84 fL (ref 79–97)
Monocytes Absolute: 0.5 x10E3/uL (ref 0.1–0.9)
Monocytes: 7 %
Neutrophils Absolute: 5 x10E3/uL (ref 1.4–7.0)
Neutrophils: 64 %
Platelets: 441 x10E3/uL (ref 150–450)
RBC: 5.33 x10E6/uL — ABNORMAL HIGH (ref 3.77–5.28)
RDW: 12.7 % (ref 11.7–15.4)
WBC: 7.7 x10E3/uL (ref 3.4–10.8)

## 2024-03-15 LAB — PROTHROMBIN GENE MUTATION

## 2024-03-15 LAB — LIPID PANEL
Chol/HDL Ratio: 3.9 ratio (ref 0.0–4.4)
Cholesterol, Total: 209 mg/dL — ABNORMAL HIGH (ref 100–199)
HDL: 54 mg/dL (ref >39)
LDL Chol Calc (NIH): 128 mg/dL — ABNORMAL HIGH (ref 0–99)
Triglycerides: 155 mg/dL — ABNORMAL HIGH (ref 0–149)
VLDL Cholesterol Cal: 27 mg/dL (ref 5–40)

## 2024-03-15 LAB — FACTOR V LEIDEN REFLEX TO R2

## 2024-03-15 LAB — HEMOGLOBIN A1C
Est. average glucose Bld gHb Est-mCnc: 103 mg/dL
Hgb A1c MFr Bld: 5.2 % (ref 4.8–5.6)

## 2024-03-15 LAB — MTHFR DNA ANALYSIS

## 2024-03-15 LAB — VITAMIN B12: Vitamin B-12: 256 pg/mL (ref 232–1245)

## 2024-03-16 ENCOUNTER — Encounter: Payer: Self-pay | Admitting: Adult Health

## 2024-03-16 ENCOUNTER — Telehealth (INDEPENDENT_AMBULATORY_CARE_PROVIDER_SITE_OTHER): Admitting: Adult Health

## 2024-03-16 DIAGNOSIS — F341 Dysthymic disorder: Secondary | ICD-10-CM

## 2024-03-16 DIAGNOSIS — G4700 Insomnia, unspecified: Secondary | ICD-10-CM

## 2024-03-16 DIAGNOSIS — F5082 Avoidant/restrictive food intake disorder: Secondary | ICD-10-CM

## 2024-03-16 DIAGNOSIS — F422 Mixed obsessional thoughts and acts: Secondary | ICD-10-CM

## 2024-03-16 NOTE — Progress Notes (Signed)
 April Williams [id number] 07-07-2000 23 y.o.  Virtual Visit via Video Note  I connected with pt @ on 03/16/2024 at  9:00 AM EST by a video enabled telemedicine application and verified that I am speaking with the correct person using two identifiers.   I discussed the limitations of evaluation and management by telemedicine and the availability of in person appointments. The patient expressed understanding and agreed to proceed.  I discussed the assessment and treatment plan with the patient. The patient was provided an opportunity to ask questions and all were answered. The patient agreed with the plan and demonstrated an understanding of the instructions.   The patient was advised to call back or seek an in-person evaluation if the symptoms worsen or if the condition fails to improve as anticipated.  I provided 20 minutes of non-face-to-face time during this encounter.  The patient was located at home.  The provider was located at [facility].   Angeline LOISE Sayers, NP   Subjective:   Patient ID:  April Williams is a 23 y.o. (DOB [birth year]) female.  Chief Complaint: No chief complaint on file.   HPI April Williams presents for follow-up of persistent depressive disorder with melancholic features, currently moderate, mixed obsessional thoughts and acts, insomnia and avoidant-restrictive food intake disorder (ARFID).  Describes mood today as ok. Pleasant. Reports tearfulness at times - hormonal. Mood symptoms - reports some depression, anxiety and irritability - not an irregular amount. Reports stable interest and motivation. Denies panic attacks. Reports some over thinking, worry and rumination - trying to distract myself. Denies intrusive thoughts. Reports mood is   stable. Stating I feel very put together lately. Feels like current medication regimen is helpful. Taking medications as prescribed.  Energy levels stable. Active, does not have a regular exercise  routine. Enjoys some usual interests and activities. Single. Lives with mother. Sister lives 1.5 hours away - father and brother lives in [place] . Spending time with family. Appetite adequate. Weight stable. Reports sleeping well most nights. Averages 7 to 8 hours.  Focus and concentration stable. Completing some household tasks. Taking 2 online classes at a community college - [employer]. Plans to transfer to [employer]. Denies SI or HI.  Denies AH or VH. Denies self harm. Denies substance use.  Previous medication trials: Pristiq   Review of Systems:  Review of Systems  Musculoskeletal:  Negative for gait problem.  Neurological:  Negative for tremors.  Psychiatric/Behavioral:         Please refer to HPI    Medications: I have reviewed the patient's current medications.  Current Outpatient Medications  Medication Sig Dispense Refill   ARIPiprazole  (ABILIFY ) 2 MG tablet Take 1 tablet (2 mg total) by mouth daily. 90 tablet 3   cholecalciferol (VITAMIN D ) 1000 units tablet Take 4,000 Units by mouth daily.     fludrocortisone  (FLORINEF ) 0.1 MG tablet Take 2 tablets (0.2 mg total) by mouth daily. 180 tablet 0   LO LOESTRIN FE  1 MG-10 MCG / 10 MCG tablet Take 1 tablet by mouth daily. 84 tablet 4   Melatonin 10 MG TABS Take by mouth.     metoprolol  tartrate (LOPRESSOR ) 25 MG tablet Take 1 tablet (25 mg total) by mouth daily. 90 tablet 2   midodrine  (PROAMATINE ) 5 MG tablet Take 2 tablets (10 mg total) by mouth 3 (three) times daily. 540 tablet 3   senna (SENOKOT) 8.6 MG tablet Take 2 tablets by mouth daily.     sertraline  (ZOLOFT ) 100 MG tablet  Take 2 tablets (200 mg total) by mouth daily. 180 tablet 3   terbinafine  (LAMISIL ) 250 MG tablet Take 1 tablet (250 mg total) by mouth daily. (Patient not taking: Reported on 03/08/2024) 14 tablet 0   traZODone  (DESYREL ) 100 MG tablet Take 1 tablet (100 mg total) by mouth at bedtime. 90 tablet 3   No current facility-administered medications for this visit.     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    Irritable bowel syndrome    Lump of right breast 06/30/2018   Nasal polyps    Obsessive-compulsive disorder    Physical deconditioning 04/21/2018   Plantar wart    POTS (postural orthostatic tachycardia syndrome)    Scoliosis of thoracolumbar spine    Vitamin D  deficiency     Family History  Problem Relation Age of Onset   Depression Mother    OCD Mother    Obesity Mother    Asthma Brother    Stroke Maternal Uncle    Depression Maternal Grandmother    Diabetes Paternal Grandmother    Migraines Neg Hx    Anxiety disorder Neg Hx    Bipolar disorder Neg Hx    Schizophrenia Neg Hx    ADD / ADHD Neg Hx    Autism Neg Hx     Social History   Socioeconomic History   Marital status: Single    Spouse name: Not on file   Number of children: Not on file   Years of education: Not on file   Highest education level: Not on file  Occupational History   Occupation: student  Tobacco Use   Smoking status: Never    Passive exposure: Never   Smokeless tobacco: Never  Vaping Use   Vaping status: Never Used  Substance and Sexual Activity   Alcohol use: Not Currently   Drug use: Never   Sexual activity: Never  Other Topics Concern   Not on file  Social History Narrative   Working on associates in retail buyer, general education studies. She is planning to transfer soon to [employer]. Wants to work with animals.       Social Drivers of Health   Tobacco Use: Low Risk (03/08/2024)   Patient  History    Smoking Tobacco Use: Never    Smokeless Tobacco Use: Never    Passive Exposure: Never  Financial Resource Strain: Low Risk (02/03/2023)   Overall Financial Resource Strain (CARDIA)    Difficulty of Paying Living Expenses: Not very hard  Food Insecurity: No Food Insecurity (02/10/2024)   Epic    Worried About Programme Researcher, Broadcasting/film/video in the Last Year: Never true    Ran Out of Food in the Last Year: Never true  Transportation Needs: No Transportation Needs (02/10/2024)   Epic    Lack of Transportation (Medical): No    Lack of Transportation (Non-Medical): No  Physical Activity: Insufficiently Active (02/10/2024)   Exercise Vital Sign    Days of Exercise per Week: 2 days    Minutes of Exercise per Session: 30  min  Stress: No Stress Concern Present (02/10/2024)   [employer] of Occupational Health - Occupational Stress Questionnaire    Feeling of Stress: Only a little  Social Connections: Socially Isolated (02/10/2024)   Social Connection and Isolation Panel    Frequency of Communication with Friends and Family: More than three times a week    Frequency of Social Gatherings with Friends and Family: Never    Attends Religious Services: Never    Database Administrator or Organizations: No    Attends Engineer, Structural: Patient declined    Marital Status: Never married  Intimate Partner Violence: Not At Risk (02/10/2024)   Epic    Fear of Current or Ex-Partner: No    Emotionally Abused: No    Physically Abused: No    Sexually Abused: No  Depression (PHQ2-9): Medium Risk (02/10/2024)   Depression (PHQ2-9)    PHQ-2 Score: 9  Alcohol Screen: Not on file  Housing: Low Risk (02/10/2024)   Epic    Unable to Pay for Housing in the Last Year: No    Number of Times Moved in the Last Year: 0    Homeless in the Last Year: No  Utilities: Not At Risk (02/10/2024)   Epic    Threatened with loss of utilities: No  Health Literacy: Adequate Health Literacy (02/10/2024)    B1300 Health Literacy    Frequency of need for help with medical instructions: Never    Past Medical History, Surgical history, Social history, and Family history were reviewed and updated as appropriate.   Please see review of systems for further details on the patient's review from today.   Objective:   Physical Exam:  There were no vitals taken for this visit.  Physical Exam Constitutional:      General: She is not in acute distress. Musculoskeletal:        General: No deformity.  Neurological:     Mental Status: She is alert and oriented to person, place, and time.     Coordination: Coordination normal.  Psychiatric:        Attention and Perception: Attention and perception normal. She does not perceive auditory or visual hallucinations.        Mood and Affect: Mood normal. Mood is not anxious or depressed. Affect is not labile, blunt, angry or inappropriate.        Speech: Speech normal.        Behavior: Behavior normal.        Thought Content: Thought content normal. Thought content is not paranoid or delusional. Thought content does not include homicidal or suicidal ideation. Thought content does not include homicidal or suicidal plan.        Cognition and Memory: Cognition and memory normal.        Judgment: Judgment normal.     Comments: Insight intact     Lab Review:     Component Value Date/Time   NA 139 03/08/2024 1406   K 4.5 03/08/2024 1406   CL 102 03/08/2024 1406   CO2 20 03/08/2024 1406   GLUCOSE 85 03/08/2024 1406   GLUCOSE 86 05/27/2023 1630   BUN 8 03/08/2024 1406   CREATININE 0.63 03/08/2024 1406   CREATININE 0.70 01/27/2019 1015   CALCIUM 9.8 03/08/2024 1406   PROT 6.9 03/08/2024 1406   ALBUMIN 4.4 03/08/2024 1406   AST 14 03/08/2024 1406   ALT 12 03/08/2024 1406   ALKPHOS 122 (H) 03/08/2024 1406   BILITOT 0.3 03/08/2024 1406  GFRNONAA >60 05/27/2023 1630   GFRAA NOT CALCULATED 12/07/2016 1402       Component Value Date/Time   WBC  7.7 03/08/2024 1406   WBC 10.7 (H) 05/27/2023 1630   RBC 5.33 (H) 03/08/2024 1406   RBC 5.36 (H) 05/27/2023 1630   HGB 14.5 03/08/2024 1406   HCT 44.8 03/08/2024 1406   PLT 441 03/08/2024 1406   MCV 84 03/08/2024 1406   MCH 27.2 03/08/2024 1406   MCH 27.4 05/27/2023 1630   MCHC 32.4 03/08/2024 1406   MCHC 33.9 05/27/2023 1630   RDW 12.7 03/08/2024 1406   LYMPHSABS 2.0 03/08/2024 1406   MONOABS 0.7 10/28/2022 1236   EOSABS 0.1 03/08/2024 1406   BASOSABS 0.1 03/08/2024 1406    No results found for: POCLITH, LITHIUM   No results found for: PHENYTOIN, PHENOBARB, VALPROATE, CBMZ   .res Assessment: Plan:    Plan:  PDMP reviewed  1. Zoloft  200mg  daily 2. Abilify  2mg  daily  3. Trazadone 100mg  - 2 at hs   Consider Vraylar   RTC 6 months  20 minutes spent dedicated to the care of this patient on the date of this encounter to include pre-visit review of records, ordering of medication, post visit documentation, and face-to-face time with the patient discussing depression, anxiety and obsessional thoughts. Discussed continuing current medication regimen.  Discussed potential metabolic side effects associated with atypical antipsychotics, as well as potential risk for movement side effects. Advised pt to contact office if movement side effects occur.   There are no diagnoses linked to this encounter.   Please see After Visit Summary for patient specific instructions.  Future Appointments  Date Time Provider Department Center  03/16/2024  9:00 AM Aarini Slee, Angeline Mattocks, NP [facility] None  04/06/2024  8:35 AM Jeralyn Crutch, MD [facility]  05/18/2024  8:50 AM Skeet Juliene SAUNDERS, DO [facility] None  02/21/2025  8:30 AM Early, Camie BRAVO, NP [facility] [phone] [place]    No orders of the defined types were placed in this encounter.     -------------------------------      [1] No Known Allergies

## 2024-03-17 NOTE — Progress Notes (Signed)
Called Pt and LVM

## 2024-03-24 ENCOUNTER — Encounter: Payer: Self-pay | Admitting: Nurse Practitioner

## 2024-03-31 NOTE — Progress Notes (Signed)
 " Cardiology Office Note:    Date:  04/01/2024   ID:  April Williams, DOB 03/24/2001, MRN [id number]  PCP:  Oris Camie BRAVO, NP   [facility] Providers Cardiologist:  None     Referring MD: Oris Camie BRAVO, NP   Chief Complaint  Patient presents with   dysautonomia    History of Present Illness:    April Williams is a 24 y.o. female seen for follow up of POTS. She has previously been followed by Dr Beverley Aldo with pediatric cardiology in [place]. She has a history of dysautonomia. Diagnosed about 6 years ago. Patient reports seeing multiple specialists at [facility], [facility] and [facility]. This has been managed with avoidance measures, as well as Florinef  and midodrine . Also on metoprolol  for atrial tachycardia.  Symptoms are multiple including paresthesias, chest pain, tachycardia with HR up to 180. Symptoms started after bout of flu and Strep. Was fairly bedbound for a couple of years. Notes heat intolerance, sweating, color changes in skin, full body jolts, HA, decreased appetite, dizziness. Only passed out once in the beginning. Has been consider for connective tissue disorder but doesn't fit for EDS. She has done PT on more than one occasion.  Currently states her symptoms are the best they have been. She is doing network engineer at [employer] She is trying to get out more but still gets tired easily with walking and if she does much may feel wiped out for a couple of days. Is planning to see Neuro here as well- Dr Skeet. Has history of pectus excavatum.   She is seen today with her mom. Only one episode of syncope after having some stitches removed otherwise doing very well. Had extensive labs in Dec which were OK. Nerve conduction tests were normal.   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   cholecalciferol (VITAMIN D ) 1000 units tablet Take 4,000 Units by mouth daily.   LO LOESTRIN FE  1 MG-10 MCG / 10 MCG tablet Take 1 tablet by mouth daily.   Melatonin 10 MG TABS Take by mouth.   metoprolol  tartrate (LOPRESSOR ) 25 MG tablet Take 1 tablet (25 mg total) by mouth daily.   midodrine  (PROAMATINE ) 5 MG tablet Take 2 tablets (10 mg total) by mouth 3 (three) times daily.   senna (SENOKOT) 8.6 MG tablet Take 2 tablets by mouth daily.   sertraline  (ZOLOFT ) 100 MG tablet Take 2 tablets (200 mg total) by mouth daily.  traZODone  (DESYREL ) 100 MG tablet Take 1 tablet (100 mg total) by mouth at bedtime.   [DISCONTINUED] fludrocortisone  (FLORINEF ) 0.1 MG tablet Take 2 tablets (0.2 mg total) by mouth daily.   [DISCONTINUED] terbinafine  (LAMISIL ) 250 MG tablet Take 1 tablet (250 mg total) by mouth daily.     Allergies:   Patient has no known allergies.   Social History   Socioeconomic History   Marital status: Single    Spouse name: Not on file   Number of children: Not on file   Years of education: Not on file   Highest education level: Not on file  Occupational History   Occupation: student  Tobacco Use   Smoking status: Never    Passive exposure: Never    Smokeless tobacco: Never  Vaping Use   Vaping status: Never Used  Substance and Sexual Activity   Alcohol use: Not Currently   Drug use: Never   Sexual activity: Never  Other Topics Concern   Not on file  Social History Narrative   Working on associates in retail buyer, general education studies. She is planning to transfer soon to [place]. Wants to work with animals.       Social Drivers of Health   Tobacco Use: Low Risk (03/16/2024)   Patient History    Smoking Tobacco Use: Never    Smokeless Tobacco Use: Never    Passive Exposure: Never  Financial Resource Strain: Low Risk (02/03/2023)   Overall Financial Resource Strain (CARDIA)    Difficulty of Paying Living Expenses: Not very hard  Food Insecurity: No Food Insecurity (02/10/2024)   Epic    Worried About Programme Researcher, Broadcasting/film/video in the Last Year: Never true    Ran Out of Food in the Last Year: Never true  Transportation Needs: No Transportation Needs (02/10/2024)   Epic    Lack of Transportation (Medical): No    Lack of Transportation (Non-Medical): No  Physical Activity: Insufficiently Active (02/10/2024)   Exercise Vital Sign    Days of Exercise per Week: 2 days    Minutes of Exercise per Session: 30 min  Stress: No Stress Concern Present (02/10/2024)   [employer] of Occupational Health - Occupational Stress Questionnaire    Feeling of Stress: Only a little  Social Connections: Socially Isolated (02/10/2024)   Social Connection and Isolation Panel    Frequency of Communication with Friends and Family: More than three times a week    Frequency of Social Gatherings with Friends and Family: Never    Attends Religious Services: Never    Database Administrator or Organizations: No    Attends Banker Meetings: Patient declined    Marital Status: Never married  Depression (PHQ2-9): Medium Risk (02/10/2024)   Depression (PHQ2-9)    PHQ-2 Score: 9  Alcohol Screen: Not on file  Housing: Low Risk  (02/10/2024)   Epic    Unable to Pay for Housing in the Last Year: No    Number of Times Moved in the Last Year: 0    Homeless in the Last Year: No  Utilities: Not At Risk (02/10/2024)   Epic    Threatened with loss of utilities: No  Health Literacy: Adequate Health Literacy (02/10/2024)   B1300 Health Literacy    Frequency of need for help with medical instructions: Never     Family History: The patient's family history includes Asthma in her brother; Depression in her maternal grandmother and mother; Diabetes in her paternal grandmother; OCD in her mother;  Obesity in her mother; Stroke in her maternal uncle. There is no history of Migraines, Anxiety disorder, Bipolar disorder, Schizophrenia, ADD / ADHD, or Autism.  ROS:   Please see the history of present illness.     All other systems reviewed and are negative.  EKGs/Labs/Other Studies Reviewed:    The following studies were reviewed today: CEM 2025/02/22 - 02/15/2020): 11d17h of data available for review. There were 25 stable events, 11 auto triggers for sinus tachycardia and two episodes of asymptomatic short VT runs (4 and 7 beats), slow at ~150 bpm. Patient triggers included shortness of breath, chest pain, rapid heart beat, flutter beats, and fatigue. These symptoms correlated with sinus rhythm and sinus tachycardia. Heart rates ranged from 58 to 184 bpm, averaging 80 bpm.  ECHO (06/02/2020): Normal cardiac segmental anatomy, chamber sizes and biventricular systolic function. The atrial and ventricular septae are intact. There is no significant valvular dysfunction. Normal aortic dimensions with no evidence of AV valve prolapse.  HOLTER (3/11 - 06/04/20): 1d19h of data available for review. The observed rhythms are sinus bradycardia to sinus tachycardia. The Maximum Heart Rate recorded was 147 bpm, Day 2 / 06:05:07 am, the Minimum Heart Rate recorded was 51 bpm, Day 2 / 09:12:26 pm and the Average Heart Rate was 69 bpm. There were 6 PVCs  with a burden of < 0.01 %. There were 7 PSVCs with a burden of < 0.01 %. There were 0 Patient triggered events. Adequate beta blockade. No significant tachycardia burden.       Recent Labs: 05/27/2023: Magnesium 1.9 09/16/2023: TSH 2.04 03/08/2024: ALT 12; BUN 8; Creatinine, Ser 0.63; Hemoglobin 14.5; Platelets 441; Potassium 4.5; Sodium 139  Recent Lipid Panel    Component Value Date/Time   CHOL 209 (H) 03/08/2024 1406   TRIG 155 (H) 03/08/2024 1406   HDL 54 03/08/2024 1406   CHOLHDL 3.9 03/08/2024 1406   LDLCALC 128 (H) 03/08/2024 1406     Risk Assessment/Calculations:      Physical Exam:    VS:  BP 100/60 (BP Location: Left Arm, Patient Position: Sitting, Cuff Size: Normal)   Pulse 98   Ht 5' 6 (1.676 m)   Wt 192 lb (87.1 kg)   SpO2 98%   BMI 30.99 kg/m     Wt Readings from Last 3 Encounters:  04/01/24 192 lb (87.1 kg)  03/08/24 191 lb 9.6 oz (86.9 kg)  02/10/24 191 lb 12.8 oz (87 kg)     GEN:  Well nourished, well developed in no acute distress HEENT: Normal NECK: No JVD; No carotid bruits LYMPHATICS: No lymphadenopathy CARDIAC: RRR, no murmurs, rubs, gallops. + pectus excavatum deformity RESPIRATORY:  Clear to auscultation without rales, wheezing or rhonchi  ABDOMEN: Soft, non-tender, non-distended MUSCULOSKELETAL:  No edema; No deformity  SKIN: Warm and dry NEUROLOGIC:  Alert and oriented x 3 PSYCHIATRIC:  Normal affect   ASSESSMENT:    1. POTS (postural orthostatic tachycardia syndrome)   2. Ectopic atrial rhythm     PLAN:    In order of problems listed above:  Dysautonomia syndrome. Extensive evaluation in the past. Symptoms currently well controlled on combination of Florinef , midodrine  and metoprolol . No change today. Discussed importance of hydration. Recommend Spanx.  Atrial tachycardia controlled on metoprolol          Follow up in 6 months   Medication Adjustments/Labs and Tests Ordered: Current medicines are reviewed at length with the  patient today.  Concerns regarding medicines are outlined above.  No orders of  the defined types were placed in this encounter.  Meds ordered this encounter  Medications   fludrocortisone  (FLORINEF ) 0.1 MG tablet    Sig: Take 2 tablets (0.2 mg total) by mouth daily.    Dispense:  180 tablet    Refill:  3    Patient Instructions  Medication Instructions:  Continue same medications *If you need a refill on your cardiac medications before your next appointment, please call your pharmacy*  Lab Work: None ordered  Testing/Procedures: None ordered  Follow-Up: At [facility], you and your health needs are our priority.  As part of our continuing mission to provide you with exceptional heart care, our providers are all part of one team.  This team includes your primary Cardiologist (physician) and Advanced Practice Providers or APPs (Physician Assistants and Nurse Practitioners) who all work together to provide you with the care you need, when you need it.  Your next appointment:  6 months    Call in April to schedule July appointment     Provider:  Dr.Shereese Bonnie   We recommend signing up for the patient portal called MyChart.  Sign up information is provided on this After Visit Summary.  MyChart is used to connect with patients for Virtual Visits (Telemedicine).  Patients are able to view lab/test results, encounter notes, upcoming appointments, etc.  Non-urgent messages can be sent to your provider as well.   To learn more about what you can do with MyChart, go to [email or web address].             Signed, Cordelro Gautreau, MD  04/01/2024 8:10 AM    [facility]  "

## 2024-04-01 ENCOUNTER — Ambulatory Visit: Attending: Cardiology | Admitting: Cardiology

## 2024-04-01 ENCOUNTER — Other Ambulatory Visit (HOSPITAL_COMMUNITY): Payer: Self-pay

## 2024-04-01 ENCOUNTER — Other Ambulatory Visit: Payer: Self-pay

## 2024-04-01 VITALS — BP 100/60 | HR 98 | Ht 66.0 in | Wt 192.0 lb

## 2024-04-01 DIAGNOSIS — G90A Postural orthostatic tachycardia syndrome (POTS): Secondary | ICD-10-CM

## 2024-04-01 DIAGNOSIS — I491 Atrial premature depolarization: Secondary | ICD-10-CM | POA: Diagnosis not present

## 2024-04-01 MED ORDER — FLUDROCORTISONE ACETATE 0.1 MG PO TABS
0.2000 mg | ORAL_TABLET | Freq: Every day | ORAL | 3 refills | Status: AC
  Filled 2024-04-01: qty 180, 90d supply, fill #0

## 2024-04-01 NOTE — Patient Instructions (Signed)

## 2024-04-02 ENCOUNTER — Other Ambulatory Visit (HOSPITAL_COMMUNITY): Payer: Self-pay

## 2024-04-02 ENCOUNTER — Other Ambulatory Visit (INDEPENDENT_AMBULATORY_CARE_PROVIDER_SITE_OTHER)

## 2024-04-02 ENCOUNTER — Other Ambulatory Visit: Payer: Self-pay

## 2024-04-02 DIAGNOSIS — E538 Deficiency of other specified B group vitamins: Secondary | ICD-10-CM

## 2024-04-02 DIAGNOSIS — E7212 Methylenetetrahydrofolate reductase deficiency: Secondary | ICD-10-CM

## 2024-04-02 MED ORDER — CYANOCOBALAMIN 1000 MCG/ML IJ SOLN
1000.0000 ug | Freq: Once | INTRAMUSCULAR | Status: AC
  Administered 2024-04-02: 1000 ug via INTRAMUSCULAR

## 2024-04-02 MED ORDER — CYANOCOBALAMIN 1000 MCG/ML IJ SOLN
1000.0000 ug | INTRAMUSCULAR | 1 refills | Status: AC
  Filled 2024-04-02: qty 3, 90d supply, fill #0
  Filled 2024-04-20 – 2024-04-30 (×2): qty 3, 90d supply, fill #1

## 2024-04-02 MED ORDER — "SYRINGE/NEEDLE (DISP) 25G X 5/8"" 3 ML MISC"
0 refills | Status: AC
  Filled 2024-04-02: qty 3, 90d supply, fill #0
  Filled 2024-04-30: qty 3, 90d supply, fill #1

## 2024-04-03 LAB — FOLATE: Folate: 3.2 ng/mL

## 2024-04-03 LAB — HOMOCYSTEINE: Homocysteine: 21.7 umol/L — ABNORMAL HIGH (ref 0.0–14.5)

## 2024-04-05 ENCOUNTER — Other Ambulatory Visit: Payer: Self-pay

## 2024-04-06 ENCOUNTER — Encounter: Payer: Self-pay | Admitting: Obstetrics and Gynecology

## 2024-04-06 ENCOUNTER — Ambulatory Visit: Admitting: Obstetrics and Gynecology

## 2024-04-06 ENCOUNTER — Encounter: Payer: Self-pay | Admitting: Nurse Practitioner

## 2024-04-06 ENCOUNTER — Other Ambulatory Visit (HOSPITAL_COMMUNITY)
Admission: RE | Admit: 2024-04-06 | Discharge: 2024-04-06 | Disposition: A | Discharge status: Home / Self Care | Source: Ambulatory Visit | Attending: Obstetrics and Gynecology | Admitting: Obstetrics and Gynecology

## 2024-04-06 ENCOUNTER — Other Ambulatory Visit (HOSPITAL_COMMUNITY): Payer: Self-pay

## 2024-04-06 VITALS — BP 126/84 | HR 88 | Wt 191.0 lb

## 2024-04-06 DIAGNOSIS — Z1589 Genetic susceptibility to other disease: Secondary | ICD-10-CM | POA: Diagnosis not present

## 2024-04-06 DIAGNOSIS — Z01419 Encounter for gynecological examination (general) (routine) without abnormal findings: Secondary | ICD-10-CM | POA: Diagnosis not present

## 2024-04-06 DIAGNOSIS — N946 Dysmenorrhea, unspecified: Secondary | ICD-10-CM | POA: Diagnosis present

## 2024-04-06 DIAGNOSIS — N926 Irregular menstruation, unspecified: Secondary | ICD-10-CM | POA: Insufficient documentation

## 2024-04-06 MED ORDER — LO LOESTRIN FE 1 MG-10 MCG / 10 MCG PO TABS
1.0000 | ORAL_TABLET | Freq: Every day | ORAL | 4 refills | Status: AC
  Filled 2024-04-06: qty 84, 84d supply, fill #0

## 2024-04-06 NOTE — Progress Notes (Signed)
 "  ANNUAL EXAM Patient name: April Williams MRN [id number]  Date of birth: 07-02-2000 Chief Complaint:   Annual Exam  History of Present Illness:   April Williams is a 24 y.o. G0P0000 being seen today for a routine annual exam.  Current complaints: recent MTHFR diagnosis  Menstrual concerns? No  lasting 2 weeks still. Not painful. Will start heavy and then a continuous small pain, not immense pain but moreso annoying.  Breast or nipple changes? No  Contraception use? Yes OCPs, doing well with it.  Sexually active? No   MTHFR and saw it may affect menstrual bleeding or contribute to abnormal bleeding. Grandmother needed B12 shots and wondering if she may have had it.   Patient's last menstrual period was 03/22/2024.   The pregnancy intention screening data noted above was reviewed. Potential methods of contraception were discussed. The patient elected to proceed with No data recorded.   Last pap No results found for: DIAGPAP, HPVHIGH, ADEQPAP Last mammogram: n/a.  Last colonoscopy: n/a.      02/10/2024    8:47 AM 02/03/2023    8:49 AM 09/20/2021    4:00 PM 04/07/2018    4:24 PM 09/03/2017   11:08 AM  Depression screen PHQ 2/9  Decreased Interest 2 1 1  0 0  Down, Depressed, Hopeless 1 1 2 2  0  PHQ - 2 Score 3 2 3 2  0  Altered sleeping 1 1 0 0 1  Tired, decreased energy 2 0 2 2 3   Change in appetite 0 0 1 0 0  Feeling bad or failure about yourself  2  1 1  0  Trouble concentrating 0 0 0 2 3  Moving slowly or fidgety/restless 0 0 0 0 2  Suicidal thoughts 1 0 1    PHQ-9 Score 9 3  8  7  9    Difficult doing work/chores Somewhat difficult Not difficult at all        Data saved with a previous flowsheet row definition        09/20/2021    4:00 PM 04/07/2018    4:24 PM 09/03/2017   11:08 AM 08/01/2017   10:56 AM  GAD 7 : Generalized Anxiety Score  Nervous, Anxious, on Edge 1 2 1 1   Control/stop worrying 1 2 0 1  Worry too much - different things 1 1 0 2  Trouble  relaxing 0 0 0 0  Restless 0 0 0 1  Easily annoyed or irritable 1 0 0 0  Afraid - awful might happen 0 0 0 0  Total GAD 7 Score 4 5 1 5      Review of Systems:   Pertinent items are noted in HPI Denies any headaches, blurred vision, fatigue, shortness of breath, chest pain, abdominal pain, abnormal vaginal discharge/itching/odor/irritation, problems with periods, bowel movements, urination, or intercourse unless otherwise stated above. Pertinent History Reviewed:  Reviewed past medical,surgical, social and family history.  Reviewed problem list, medications and allergies. Physical Assessment:   Vitals:   04/06/24 0820  BP: 126/84  Pulse: 88  Weight: 191 lb (86.6 kg)  Body mass index is 30.83 kg/m.        Physical Examination:   General appearance - well appearing, and in no distress  Mental status - alert, oriented to person, place, and time  Psych:  She has a normal mood and affect  Skin - warm and dry, normal color, no suspicious lesions noted  Chest - effort normal  Breasts - deferred  Abdomen - soft, nontender, nondistended, no masses or organomegaly  Pelvic -  VULVA: normal appearing vulva with no masses, tenderness or lesions   VAGINA: normal appearing vagina with normal color and discharge, no lesions. Nontender bilateral levator ani   CERVIX: not fully visualized, nontender on bimanual exam, nulliparous  Thin prep pap is done with HR HPV cotesting  UTERUS: uterus is felt to be normal size, shape, consistency and nontender   ADNEXA: No adnexal masses or tenderness noted.  Extremities:  No swelling or varicosities noted  Chaperone present for exam  No results found for this or any previous visit (from the past 24 hours).    Assessment & Plan:   1. Well woman exam with routine gynecological exam (Primary) - Cervical cancer screening: Discussed screening Q3 years. Reviewed importance of annual exams and limits of pap smear. Pap with reflex HPV collected - GC/CT:  Discussed and recommended. Pt  declines - Gardasil: completed - Birth Control: OCPs - Breast Health: Encouraged self breast awareness/exams.  - Follow-up: 12 months and prn   2. Dysmenorrhea 3. Irregular menstrual cycle Discussed options of continuing current OCP or switching. Suspect likely needs more estrogenic support in pill but concerned about increased risk of VTE given mutation. Will continue with current pill an dwill contact if decises to  - Cytology - PAP  4. MTHFR mutation Noted that most often mutation appears to compromise B12 and folate use and if elevated homocysteine levels may have increased VTE risk. Not sure of true implication on cycles when well controlled but recommend vigilance regarding VTE symptoms. Will avoid increasing OCP at this time.    No orders of the defined types were placed in this encounter.   Meds: No orders of the defined types were placed in this encounter.   Follow-up: No follow-ups on file.  Carter Quarry, MD 04/06/2024 8:34 AM "

## 2024-04-07 ENCOUNTER — Ambulatory Visit: Payer: Self-pay | Admitting: Obstetrics and Gynecology

## 2024-04-07 LAB — CYTOLOGY - PAP
Adequacy: ABSENT
Diagnosis: NEGATIVE

## 2024-04-08 ENCOUNTER — Ambulatory Visit: Payer: Self-pay | Admitting: Nurse Practitioner

## 2024-04-09 ENCOUNTER — Other Ambulatory Visit: Payer: Self-pay

## 2024-04-09 DIAGNOSIS — E538 Deficiency of other specified B group vitamins: Secondary | ICD-10-CM

## 2024-04-09 DIAGNOSIS — E611 Iron deficiency: Secondary | ICD-10-CM

## 2024-04-20 ENCOUNTER — Other Ambulatory Visit (HOSPITAL_COMMUNITY): Payer: Self-pay

## 2024-04-20 ENCOUNTER — Encounter: Payer: Self-pay | Admitting: Nurse Practitioner

## 2024-05-18 ENCOUNTER — Ambulatory Visit: Admitting: Neurology

## 2024-07-09 ENCOUNTER — Other Ambulatory Visit

## 2024-09-14 ENCOUNTER — Telehealth: Admitting: Adult Health

## 2025-02-21 ENCOUNTER — Encounter: Admitting: Nurse Practitioner
